# Patient Record
Sex: Female | Born: 1935 | Race: White | Hispanic: No | Smoking: Never smoker
Health system: Southern US, Community
[De-identification: ages and names within clinical notes are randomized; demographics above are authoritative.]

## PROBLEM LIST (undated history)

## (undated) DIAGNOSIS — K5792 Diverticulitis of intestine, part unspecified, without perforation or abscess without bleeding: Secondary | ICD-10-CM

## (undated) DIAGNOSIS — E079 Disorder of thyroid, unspecified: Secondary | ICD-10-CM

## (undated) DIAGNOSIS — M1990 Unspecified osteoarthritis, unspecified site: Secondary | ICD-10-CM

## (undated) DIAGNOSIS — I10 Essential (primary) hypertension: Secondary | ICD-10-CM

## (undated) DIAGNOSIS — K219 Gastro-esophageal reflux disease without esophagitis: Secondary | ICD-10-CM

## (undated) HISTORY — PX: JOINT REPLACEMENT: SHX530

## (undated) HISTORY — PX: CHOLECYSTECTOMY: SHX55

## (undated) HISTORY — DX: Gastro-esophageal reflux disease without esophagitis: K21.9

## (undated) HISTORY — PX: BREAST BIOPSY: SHX20

## (undated) HISTORY — PX: HERNIA REPAIR: SHX51

## (undated) HISTORY — DX: Disorder of thyroid, unspecified: E07.9

## (undated) HISTORY — PX: COLON SURGERY: SHX602

## (undated) HISTORY — PX: ROTATOR CUFF REPAIR: SHX139

## (undated) HISTORY — PX: EXCISION MORTON'S NEUROMA: SHX5013

## (undated) HISTORY — PX: CATARACT EXTRACTION: SUR2

---

## 2009-12-02 ENCOUNTER — Ambulatory Visit: Payer: Self-pay | Admitting: Family Medicine

## 2010-01-05 ENCOUNTER — Ambulatory Visit: Payer: Self-pay | Admitting: Family Medicine

## 2010-07-21 ENCOUNTER — Ambulatory Visit: Payer: Self-pay | Admitting: Family Medicine

## 2010-09-15 ENCOUNTER — Ambulatory Visit: Payer: Self-pay | Admitting: Gastroenterology

## 2010-10-06 ENCOUNTER — Ambulatory Visit: Payer: Self-pay | Admitting: Family Medicine

## 2011-01-07 ENCOUNTER — Ambulatory Visit: Payer: Self-pay | Admitting: Family Medicine

## 2011-01-26 ENCOUNTER — Ambulatory Visit: Payer: Self-pay | Admitting: Family Medicine

## 2012-01-26 ENCOUNTER — Ambulatory Visit: Payer: Self-pay | Admitting: Pediatrics

## 2013-01-26 ENCOUNTER — Ambulatory Visit: Payer: Self-pay | Admitting: Pediatrics

## 2014-03-05 ENCOUNTER — Ambulatory Visit: Payer: Self-pay | Admitting: Pediatrics

## 2016-11-24 ENCOUNTER — Ambulatory Visit
Admission: EM | Admit: 2016-11-24 | Discharge: 2016-11-24 | Disposition: A | Discharge status: Home / Self Care | Payer: Medicare Other | Attending: Family Medicine | Admitting: Family Medicine

## 2016-11-24 DIAGNOSIS — Z881 Allergy status to other antibiotic agents status: Secondary | ICD-10-CM | POA: Diagnosis not present

## 2016-11-24 DIAGNOSIS — Z9660 Presence of unspecified orthopedic joint implant: Secondary | ICD-10-CM | POA: Insufficient documentation

## 2016-11-24 DIAGNOSIS — Z9889 Other specified postprocedural states: Secondary | ICD-10-CM | POA: Diagnosis not present

## 2016-11-24 DIAGNOSIS — Z880 Allergy status to penicillin: Secondary | ICD-10-CM | POA: Diagnosis not present

## 2016-11-24 DIAGNOSIS — Z882 Allergy status to sulfonamides status: Secondary | ICD-10-CM | POA: Insufficient documentation

## 2016-11-24 DIAGNOSIS — I10 Essential (primary) hypertension: Secondary | ICD-10-CM | POA: Diagnosis not present

## 2016-11-24 DIAGNOSIS — M1990 Unspecified osteoarthritis, unspecified site: Secondary | ICD-10-CM | POA: Insufficient documentation

## 2016-11-24 DIAGNOSIS — E876 Hypokalemia: Secondary | ICD-10-CM | POA: Insufficient documentation

## 2016-11-24 DIAGNOSIS — R42 Dizziness and giddiness: Secondary | ICD-10-CM | POA: Insufficient documentation

## 2016-11-24 HISTORY — DX: Unspecified osteoarthritis, unspecified site: M19.90

## 2016-11-24 HISTORY — DX: Essential (primary) hypertension: I10

## 2016-11-24 HISTORY — DX: Diverticulitis of intestine, part unspecified, without perforation or abscess without bleeding: K57.92

## 2016-11-24 LAB — CBC WITH DIFFERENTIAL/PLATELET
BASOS ABS: 0 10*3/uL (ref 0–0.1)
Basophils Relative: 1 %
EOS PCT: 3 %
Eosinophils Absolute: 0.2 10*3/uL (ref 0–0.7)
HCT: 36 % (ref 35.0–47.0)
HEMOGLOBIN: 11.9 g/dL — AB (ref 12.0–16.0)
LYMPHS ABS: 1.3 10*3/uL (ref 1.0–3.6)
LYMPHS PCT: 22 %
MCH: 30.2 pg (ref 26.0–34.0)
MCHC: 33.1 g/dL (ref 32.0–36.0)
MCV: 91.5 fL (ref 80.0–100.0)
Monocytes Absolute: 0.4 10*3/uL (ref 0.2–0.9)
Monocytes Relative: 7 %
NEUTROS ABS: 3.9 10*3/uL (ref 1.4–6.5)
NEUTROS PCT: 67 %
PLATELETS: 211 10*3/uL (ref 150–440)
RBC: 3.94 MIL/uL (ref 3.80–5.20)
RDW: 14 % (ref 11.5–14.5)
WBC: 5.8 10*3/uL (ref 3.6–11.0)

## 2016-11-24 LAB — BASIC METABOLIC PANEL
ANION GAP: 7 (ref 5–15)
BUN: 29 mg/dL — ABNORMAL HIGH (ref 6–20)
CHLORIDE: 104 mmol/L (ref 101–111)
CO2: 28 mmol/L (ref 22–32)
Calcium: 8.7 mg/dL — ABNORMAL LOW (ref 8.9–10.3)
Creatinine, Ser: 0.97 mg/dL (ref 0.44–1.00)
GFR calc Af Amer: >60 mL/min (ref >60)
GFR, EST NON AFRICAN AMERICAN: 53 mL/min — AB (ref >60)
GLUCOSE: 110 mg/dL — AB (ref 65–99)
POTASSIUM: 3.2 mmol/L — AB (ref 3.5–5.1)
Sodium: 139 mmol/L (ref 135–145)

## 2016-11-24 MED ORDER — POTASSIUM CHLORIDE CRYS ER 20 MEQ PO TBCR
40.0000 meq | EXTENDED_RELEASE_TABLET | Freq: Once | ORAL | Status: AC
Start: 2016-11-24 — End: 2016-11-24
  Administered 2016-11-24: 40 meq via ORAL

## 2016-11-24 NOTE — Discharge Instructions (Signed)
Increase fluids and potassium rich foods Follow up with primary provider as scheduled

## 2016-11-24 NOTE — ED Triage Notes (Signed)
Pt reports dizziness x 1 month. Pt went to PT for back this morning and informed her she has been feeling dizzy. PCP called her today and stated the office would be closed tomorrow so her appointment was canceled. PT suggested she be seen today. Pt reports the dizziness is worse when turning over in bed. Endorses nausea at times. Denies confusion, slurred speech, unilateral weakness. A&O x 4. Gait steady

## 2016-11-24 NOTE — ED Provider Notes (Signed)
[facility]    CSN: [id number] Arrival date & time: 11/24/16  1244     History   Chief Complaint Chief Complaint  Patient presents with  . Dizziness    HPI Andrea Blake is a 83 y.o. female.   The history is provided by the patient.  Dizziness  Quality:  Lightheadedness Severity:  Moderate Onset quality:  Sudden Duration:  1 month Timing:  Intermittent Progression:  Waxing and waning Chronicity:  New Context: physical activity   Relieved by:  None tried Worsened by:  Movement and standing up Ineffective treatments:  None tried Associated symptoms: no blood in stool, no chest pain, no diarrhea, no headaches, no hearing loss, no nausea, no palpitations, no shortness of breath, no syncope, no tinnitus, no vision changes, no vomiting and no weakness   Risk factors: no anemia, no heart disease, no hx of stroke, no multiple medications and no new medications     Past Medical History:  Diagnosis Date  . Arthritis   . Diverticulitis   . Hypertension     There are no active problems to display for this patient.   Past Surgical History:  Procedure Laterality Date  . COLON SURGERY    . JOINT REPLACEMENT      OB History    No data available       Home Medications    Prior to Admission medications   Not on File    Family History No family history on file.  Social History Social History  Substance Use Topics  . Smoking status: Never Smoker  . Smokeless tobacco: Never Used  . Alcohol use No     Allergies   Flagyl [metronidazole]; Penicillins; and Sulfa antibiotics   Review of Systems Review of Systems  HENT: Negative for hearing loss and tinnitus.   Respiratory: Negative for shortness of breath.   Cardiovascular: Negative for chest pain, palpitations and syncope.  Gastrointestinal: Negative for blood in stool, diarrhea, nausea and vomiting.  Neurological: Positive for dizziness. Negative for weakness and headaches.      Physical Exam Triage Vital Signs ED Triage Vitals  Enc Vitals Group     BP 11/24/16 1257 (!) 134/47     Pulse Rate 11/24/16 1257 (!) 57     Resp 11/24/16 1257 16     Temp 11/24/16 1257 (!) 97.5 F (36.4 C)     Temp Source 11/24/16 1257 Oral     SpO2 11/24/16 1257 100 %     Weight 11/24/16 1258 180 lb (81.6 kg)     Height 11/24/16 1258 5\' 1"  (1.549 m)     Head Circumference --      Peak Flow --      Pain Score 11/24/16 1306 3     Pain Loc --      Pain Edu? --      Excl. in GC? --    No data found.   Updated Vital Signs BP (!) 134/47 (BP Location: Left Arm)   Pulse (!) 57   Temp (!) 97.5 F (36.4 C) (Oral)   Resp 16   Ht 5\' 1"  (1.549 m)   Wt 180 lb (81.6 kg)   SpO2 100%   BMI 34.01 kg/m   Visual Acuity Right Eye Distance:   Left Eye Distance:   Bilateral Distance:    Right Eye Near:   Left Eye Near:    Bilateral Near:     Physical Exam  Constitutional: She is oriented to person,  place, and time. She appears well-developed and well-nourished. No distress.  HENT:  Head: Normocephalic.  Right Ear: Tympanic membrane, external ear and ear canal normal.  Left Ear: Tympanic membrane, external ear and ear canal normal.  Nose: Nose normal.  Mouth/Throat: Oropharynx is clear and moist and mucous membranes are normal.  Eyes: Pupils are equal, round, and reactive to light. Conjunctivae and EOM are normal. Right eye exhibits no discharge. Left eye exhibits no discharge. No scleral icterus.  Neck: Normal range of motion. Neck supple. No JVD present. No tracheal deviation present. No thyromegaly present.  Cardiovascular: Normal rate, regular rhythm, normal heart sounds and intact distal pulses.   No murmur heard. Pulmonary/Chest: Effort normal and breath sounds normal. No stridor. No respiratory distress. She has no wheezes. She has no rales. She exhibits no tenderness.  Abdominal: Soft. Bowel sounds are normal. She exhibits no distension and no mass. There is no  tenderness. There is no rebound and no guarding.  Musculoskeletal: She exhibits no edema or tenderness.  Lymphadenopathy:    She has no cervical adenopathy.  Neurological: She is alert and oriented to person, place, and time. She has normal reflexes. She displays normal reflexes. No cranial nerve deficit or sensory deficit. She exhibits normal muscle tone. Coordination normal.  Skin: Skin is warm and dry. No rash noted. She is not diaphoretic. No erythema. No pallor.  Psychiatric: She has a normal mood and affect. Her behavior is normal. Judgment and thought content normal.  Vitals reviewed.    UC Treatments / Results  Labs (all labs ordered are listed, but only abnormal results are displayed) Labs Reviewed  CBC WITH DIFFERENTIAL/PLATELET - Abnormal; Notable for the following:       Result Value   Hemoglobin 11.9 (*)    All other components within normal limits  BASIC METABOLIC PANEL - Abnormal; Notable for the following:    Potassium 3.2 (*)    Glucose, Bld 110 (*)    BUN 29 (*)    Calcium 8.7 (*)    GFR calc non Af Amer 53 (*)    All other components within normal limits    EKG  EKG Interpretation None       Radiology No results found.  Procedures Procedures (including critical care time)  Medications Ordered in UC Medications  potassium chloride SA (K-DUR,KLOR-CON) CR tablet 40 mEq (40 mEq Oral Given 11/24/16 1450)     Initial Impression / Assessment and Plan / UC Course  I have reviewed the triage vital signs and the nursing notes.  Pertinent labs & imaging results that were available during my care of the patient were reviewed by me and considered in my medical decision making (see chart for details).       Final Clinical Impressions(s) / UC Diagnoses   Final diagnoses:  Lightheadedness  Hypokalemia    New Prescriptions There are no discharge medications for this patient. 1. Lab results and diagnosis reviewed with patient 2.patient given KCL 40  MEQ x 1  3. Recommend supportive treatment with increased fluids; f/u with PCP for recheck labs (potassium) 4. Follow-up prn if symptoms worsen or don't improve   Controlled Substance Prescriptions [place] Controlled Substance Registry consulted? Not Applicable   Payton Mccallum, MD 11/24/16 1949

## 2017-01-14 ENCOUNTER — Other Ambulatory Visit: Payer: Self-pay | Admitting: Otolaryngology

## 2017-01-14 DIAGNOSIS — R42 Dizziness and giddiness: Secondary | ICD-10-CM

## 2017-01-25 ENCOUNTER — Ambulatory Visit: Payer: Medicare Other

## 2017-04-14 ENCOUNTER — Other Ambulatory Visit: Payer: Self-pay | Admitting: Family Medicine

## 2017-04-14 DIAGNOSIS — Z1239 Encounter for other screening for malignant neoplasm of breast: Secondary | ICD-10-CM

## 2017-04-28 ENCOUNTER — Ambulatory Visit
Admission: RE | Admit: 2017-04-28 | Discharge: 2017-04-28 | Disposition: A | Discharge status: Home / Self Care | Payer: Medicare Other | Source: Ambulatory Visit | Attending: Family Medicine | Admitting: Family Medicine

## 2017-04-28 DIAGNOSIS — Z1231 Encounter for screening mammogram for malignant neoplasm of breast: Secondary | ICD-10-CM | POA: Diagnosis present

## 2017-04-28 DIAGNOSIS — Z1239 Encounter for other screening for malignant neoplasm of breast: Secondary | ICD-10-CM

## 2018-02-15 ENCOUNTER — Ambulatory Visit: Payer: Medicare Other | Attending: Geriatric Medicine | Admitting: Physical Therapy

## 2018-02-15 DIAGNOSIS — M79641 Pain in right hand: Secondary | ICD-10-CM | POA: Insufficient documentation

## 2018-02-15 DIAGNOSIS — M79642 Pain in left hand: Secondary | ICD-10-CM | POA: Insufficient documentation

## 2018-02-15 DIAGNOSIS — M6281 Muscle weakness (generalized): Secondary | ICD-10-CM | POA: Insufficient documentation

## 2018-02-15 DIAGNOSIS — R278 Other lack of coordination: Secondary | ICD-10-CM | POA: Insufficient documentation

## 2018-02-15 NOTE — Therapy (Signed)
Pt. Was scheduled on PT schedule and arrived prior to determining MD order specified OT evaluation and treatment.  Pt. Was rescheduled with OT [facility]

## 2018-02-16 ENCOUNTER — Encounter: Payer: Self-pay | Admitting: Occupational Therapy

## 2018-02-16 ENCOUNTER — Ambulatory Visit: Payer: Medicare Other | Admitting: Occupational Therapy

## 2018-02-16 ENCOUNTER — Other Ambulatory Visit: Payer: Self-pay

## 2018-02-16 DIAGNOSIS — M79641 Pain in right hand: Secondary | ICD-10-CM | POA: Diagnosis present

## 2018-02-16 DIAGNOSIS — R278 Other lack of coordination: Secondary | ICD-10-CM

## 2018-02-16 DIAGNOSIS — M6281 Muscle weakness (generalized): Secondary | ICD-10-CM

## 2018-02-16 DIAGNOSIS — M79642 Pain in left hand: Secondary | ICD-10-CM | POA: Diagnosis present

## 2018-02-16 NOTE — Addendum Note (Signed)
Addended by: Avon GullyJAGENTENFL, Kleo Dungee M on: 02/16/2018 04:34 PM   Modules accepted: Orders

## 2018-02-16 NOTE — Therapy (Addendum)
[facility] MAIN [facility] SERVICES [address] [place], [place], [address] Phone: [phone]   Fax:  [phone]  Occupational Therapy Evaluation  Patient Details  Name: Andrea Blake MRN: [id number] Date of Birth: December 24, 1931 Referring Provider (OT): Dr. Rosie Fate   Encounter Date: 02/16/2018  OT End of Session - 02/16/18 1130    Visit Number  1    Number of Visits  24    Date for OT Re-Evaluation  05/11/18    Authorization Type  Visit 1 of 10 for progress report period starting 02/16/2018    OT Start Time  0830    OT Stop Time  0930    OT Time Calculation (min)  60 min    Activity Tolerance  Patient tolerated treatment well    Behavior During Therapy  [facility] for tasks assessed/performed       Past Medical History:  Diagnosis Date  . Arthritis   . Diverticulitis   . Hypertension     Past Surgical History:  Procedure Laterality Date  . BREAST BIOPSY     fna, ?side-neg  . COLON SURGERY    . JOINT REPLACEMENT      There were no vitals filed for this visit.  Subjective Assessment - 02/16/18 1119    Subjective   Pt. reports she had a hard time finding an OT.    Pertinent History  Pt. is an 84 y.o. female who was referred for OT services by her physician secondary to Bilateral Hand Osteoarthritis, a history of back pain, and a history of right shoulder limitations secondary to Rotator Cuff Repair in 2011. Pt. resides at home alone in a 55 and older independent living community. Pt. has a supportive daughter who lives relatively close by who is able to assist if needed. Pt. has a housekeeper who assisted with home cleaning management. Pt.'s ahas a history of back pain which ranges  from 3-8/10 making it difficult to tolerate activity in standing. Pt. 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      Patient Stated Goals  To be able to use her hands without dropping items.    Currently in Pain?  No/denies         [facility] OT Assessment - 02/16/18 0835      Assessment   Medical Diagnosis  Bilateral Hand Osteoarthritis    Referring Provider (OT)  Dr. Susette Racer, Rosie Fate   Onset Date/Surgical Date  01/25/18    Hand Dominance  Right    Next MD Visit  03/01/2018      Precautions   Required Braces or Orthoses  --   Compression socks     Balance Screen   Has the patient fallen in the past 6 months  No    Has the patient had a decrease in activity level because of a fear of falling?   Yes    Is the patient reluctant to leave their home because of a fear of falling?   No      Home  Environment   Family/patient expects to be discharged to:  Private residence    Living Arrangements  Alone    Available Help at Discharge  Family    Type of Home  House   55 and older    Home Access  Level entry    Home Layout  One level    Bathroom Shower/Tub  Company secretary  Standard    Home Equipment  Bedside commode;Hand held shower head   Rollator   Lives With  Alone      Prior Function   Level of Independence  Independent    Vocation  Retired   Engineer, civil (consulting)   Leisure  --   Quilting     ADL   Eating/Feeding  --   Difficulty stabilizing fork whils cutting. Opening packets.   Grooming  --   Difficulty reaching up for haircare.   Upper Body Bathing  Independent    Lower Body Bathing  Independent    Upper Body Dressing  Independent   Difficulty hooking bra in the back. Now wears sports bras   Lower Body Dressing  Moderate assistance    Toilet Transfer  Independent    Toileting -  Hygiene  Independent    Tub/Shower Transfer  Modified independent   with grab bar     IADL   Shopping  --   Back pain with prolonged standing in line in the store.   Light Housekeeping  --   Has assist with cleaning, mopping. Has a housekeeper.   Meal Prep  --   Difficulty standing secondary to pain, DOes simple meals.   Prior Level of Function Medication Managment  --   Independent    Medication Management  Is responsible for taking medication in correct dosages at correct time   Independent   Financial Management  Manages financial matters independently (budgets, writes checks, pays rent, bills goes to bank), collects and keeps track of income      Written Expression   Dominant Hand  Right    Handwriting  90% legible      Vision - History   Baseline Vision  Bifocals   Prism classes     Activity Tolerance   Activity Tolerance  Tolerates 10-20 min activity with multiple rests      Cognition   Overall Cognitive Status  Within Functional Limits for tasks assessed      Sensation   Light Touch  Appears Intact    Stereognosis  Appears Intact      Coordination   Gross Motor Movements are Fluid and Coordinated  Yes    Fine Motor Movements are Fluid and Coordinated  No    Right 9 Hole Peg Test  32    Left 9 Hole Peg Test  30      AROM   Overall AROM Comments  Right shoulder flexion 85, abduction 83. Limited fleft hand fisting: Digiit flexion to [facility]: 2nd: 4cm, 3rd: 5cm, 4th: 4cm, 5th: 4 cm. Left hand: 3rd digit: 3cm , 2nd, 4th, and 5th WNL   History of right shoulder limitiations due to RTC Repair      Strength   Overall Strength Comments  Right shoulder flexion, abduction 3-/5, elbow flexion, extension, and wrist extension 4+/5, LUE: 4/5 overall.      Hand Function   Right Hand Grip (lbs)  22    Right Hand Lateral Pinch  7 lbs    Right Hand 3 Point Pinch  5 lbs    Left Hand Grip (lbs)  3    Left Hand Lateral Pinch  4 lbs    Left 3 point pinch  5 lbs                           OT Long Term Goals - 02/16/18 1224      OT LONG TERM GOAL #  1   Title  Pt. will increase BUE strength by 2 mm grades to assist with ADLs, and IADLs    Baseline  Eval: BUE Weakness    Time  12    Period  Weeks    Status  New    Target Date  05/11/18      OT LONG TERM GOAL #2   Title  Pt. will perform LE dressing with Modified independence.    Baseline  Eval:  Pt. requires ModA    Time  12    Period  Weeks    Status  New    Target Date  05/11/18      OT LONG TERM GOAL #3   Title  Pt. will improve grip strength to be able to independently hold and open jars, and bottles    Baseline  Eval: Pt. has difficulty holding, and opening jars and bottles.     Time  12    Period  Weeks    Status  New    Target Date  05/11/18      OT LONG TERM GOAL #4   Title  Pt. will improve lateral pinch to be able to independently stabilize a fork while cutting food.     Baseline  EVal: Pt. is unable to stabilize a fork while cutting food.    Time  12    Period  Weeks    Status  New    Target Date  05/11/18      OT LONG TERM GOAL #5   Title  Pt. will independently demonstrate work simplification techniques, energy conservation, and joint protection principals during ADL, and IADLs.    Baseline  Eval: Education to be provided.    Time  12    Period  Weeks    Status  New    Target Date  05/11/18      Long Term Additional Goals   Additional Long Term Goals  Yes      OT LONG TERM GOAL #6   Title  Pt. will improve [facility] skills to be able to handle and count money/change.    Baseline  Eval: pt. is unable    Time  12    Period  Weeks    Status  New    Target Date  05/11/18      OT LONG TERM GOAL #7   Title  Pt. will increase left hand digit flexion ROM to be able to independently hold a small object in the palm of her hand without dropping it.      Baseline  Eval: Limited ability to formulate a composite fist.    Time  12    Period  Weeks    Status  New    Target Date  05/11/18      OT LONG TERM GOAL #8   Title  Pt. will be independent wperforming haircare    Baseline  Eval: Pt. is unable to complete with her right UE.    Time  12    Period  Weeks    Status  New    Target Date  05/11/18            Plan - 02/16/18 1132    Clinical Impression Statement  Pt. is an 84 y.o. female who was referred for OT services secondary to Bilateral hand  Osteoarthritis. Pt. presents with right shoulder limitations from rotator cuff surgery in 2011, limiting her abiliaty to reach up with her RUE during self-grooming  tasks, and IADLs.. Pt. has a history of back pain limiting her ability to stand to complete meal preparation, wwaiting in line at the grocery store, and perform LE dressing. Pt. presents with limited bilateral hand ROM, is unable to make a fist with the left hand, making it difficult to hold utensils steady while cutting, open medication bottles, zipping, handling, and counting money, and writing. Pt. scored 64/80 Sum score/61.1 MAM Measure score on the MAM-20. Pt. will benefit from OT serivces to work on improving UE strength, and coordination skills in order to improves her ability to complete ADL, and IADL tasks,  and review compensatory strategies to increase independence with daily self-care.    Occupational Profile and client history currently impacting functional performance  Pt. is a retired Engineer, civil (consulting), and participates in quilting shows.    Occupational performance deficits (Please refer to evaluation for details):  ADL's;IADL's;Leisure;Social Participation    Rehab Potential  Good    OT Frequency  2x / week    OT Duration  12 weeks    OT Treatment/Interventions  Self-care/ADL training;DME and/or AE instruction;Patient/family education;Paraffin;Moist Heat;Energy conservation;Therapeutic activities;Passive range of motion    Clinical Decision Making  Several treatment options, min-mod task modification necessary    Consulted and Agree with Plan of Care  Patient       Patient will benefit from skilled therapeutic intervention in order to improve the following deficits and impairments:  Decreased balance, Impaired UE functional use, Decreased strength, Decreased range of motion, Decreased endurance, Decreased activity tolerance, Pain, Decreased knowledge of precautions, Decreased coordination  Visit Diagnosis: Muscle weakness  (generalized)  Other lack of coordination    Problem List There are no active problems to display for this patient.   Olegario Messier, MS, OTR/L 02/16/2018, 3:22 PM  [facility] MAIN [facility] SERVICES [address] [place], [place], [address] Phone: [phone]   Fax:  [phone]  Name: Andrea Blake MRN: [id number] Date of Birth: 07/27/1931

## 2018-02-20 ENCOUNTER — Ambulatory Visit: Payer: Medicare Other | Admitting: Occupational Therapy

## 2018-02-20 ENCOUNTER — Encounter: Payer: Self-pay | Admitting: Occupational Therapy

## 2018-02-20 DIAGNOSIS — R278 Other lack of coordination: Secondary | ICD-10-CM

## 2018-02-20 DIAGNOSIS — M6281 Muscle weakness (generalized): Secondary | ICD-10-CM

## 2018-02-20 DIAGNOSIS — M79641 Pain in right hand: Secondary | ICD-10-CM | POA: Diagnosis not present

## 2018-02-20 NOTE — Therapy (Signed)
[facility] MAIN [facility] [address] [place], [place], [address] Phone: [phone]   Fax:  [phone]  Occupational Therapy Treatment  Patient Details  Name: Andrea AntuBarbara Shook Blake MRN: [id number] Date of Birth: [birth year] Referring Provider (OT): Dr. Claris CheMargaret Blake,Dr. Rosie FateLauren Blake   Encounter Date: 02/20/2018  OT End of Session - 02/20/18 0835    Visit Number  2    Number of Visits  24    Date for OT Re-Evaluation  05/11/18    Authorization Type  Visit 2 of 10 for progress report period starting 02/16/2018    OT Start Time  0833    OT Stop Time  0915    OT Time Calculation (min)  42 min    Activity Tolerance  Patient tolerated treatment well    Behavior During Therapy  [facility] for tasks assessed/performed       Past Medical History:  Diagnosis Date  . Arthritis   . Diverticulitis   . Hypertension     Past Surgical History:  Procedure Laterality Date  . BREAST BIOPSY     fna, ?side-neg  . COLON SURGERY    . JOINT REPLACEMENT      There were no vitals filed for this visit.  Subjective Assessment - 02/20/18 0835    Subjective   Pt. reports no new changes    Pertinent History  Pt. is an 84 y.o. female who was referred for OT services by her physician secondary to Bilateral Hand Osteoarthritis, a history of back pain, and a history of right shoulder limitations secondary to Rotator Cuff Repair in 2011. Pt. resides at home alone in a 55 and older independent living community. Pt. has a supportive daughter who lives relatively close by who is able to assist if needed. Pt. has a housekeeper who assisted with home cleaning management. Pt.'s ahas a history of back pain which ranges  from 3-8/10 making it difficult to tolerate activity in standing. Pt. is a retired Engineer, civil (consulting)nurse, and an avid Therapist, occupationalquilter which she has won Radiographer, therapeuticawards for.      Patient Stated Goals  To be able to use her hands without dropping items.    Currently in Pain?   No/denies      OT TREATMENT    Neuro muscular re-education:  Pt. worked on grasping coins with her right hand, and moved the objects through her hand from her palm to the tip of her 2nd digit, and thumb in preparation for stacking the coins. Pt. had difficulty with translatory movements of the hand, however did not drop any.  Therapeutic Exercise:  Pt. performed hand strengthening with tan theraputty. Pt. required visual, and verbal cues for proper technique. Pt. worked on gross grip loop, lateral pinch, 3pt. pinch, gross digit extension, digit extension table spread, thumb opposition, and lumbical ex,  Pt. required verbal and tactile cues for proper technique. Pt. was provided with a [employer] HEP for Theraputty Ex                               OT Long Term Goals - 02/16/18 1224      OT LONG TERM GOAL #1   Title  Pt. will increase BUE strength by 2 mm grades to assist with ADLs, and IADLs    Baseline  Eval: BUE Weakness    Time  12    Period  Weeks    Status  New  Target Date  05/11/18      OT LONG TERM GOAL #2   Title  Pt. will perform LE dressing with Modified independence.    Baseline  Eval: Pt. requires ModA    Time  12    Period  Weeks    Status  New    Target Date  05/11/18      OT LONG TERM GOAL #3   Title  Pt. will improve grip strength to be able to independently hold and open jars, and bottles    Baseline  Eval: Pt. has difficulty holding, and opening jars and bottles.     Time  12    Period  Weeks    Status  New    Target Date  05/11/18      OT LONG TERM GOAL #4   Title  Pt. will improve lateral pinch to be able to independently stabilize a fork while cutting food.     Baseline  Eval: Pt. is unable to stabilize a fork while cutting food.    Time  12    Period  Weeks    Status  New    Target Date  05/11/18      OT LONG TERM GOAL #5   Title  Pt. will independently demonstrate work simplification techniques, energy conservation,  and joint protection principals during ADL, and IADLs.    Baseline  Eval: Education to be provided.    Time  12    Period  Weeks    Status  New    Target Date  05/11/18      Long Term Additional Goals   Additional Long Term Goals  Yes      OT LONG TERM GOAL #6   Title  Pt. will improve [facility] skills to be able to handle and count money/change.    Baseline  Eval: pt. is unable    Time  12    Period  Weeks    Status  New    Target Date  05/11/18      OT LONG TERM GOAL #7   Title  Pt. will increase left hand digit flexion ROM to be able to independently hold a small object in the palm of her hand without dropping it.      Baseline  Eval: Limited ability to formulate a composite fist.    Time  12    Period  Weeks    Status  New    Target Date  05/11/18      OT LONG TERM GOAL #8   Title  Pt. will be independent wperforming haircare    Baseline  Eval: Pt. is unable to complete with her right UE.    Time  12    Period  Weeks    Status  New    Target Date  05/11/18            Plan - 02/20/18 0847    Clinical Impression Statemen Pt. [place] for a [employer], and a Musician. Pt. was provided with red adaptive foam to build up utensils, and items for ADLs. Pt. reported the she preferred not to use the adaptive foam on the fork when stabilizing the fork when cutting.  Pt. education about work simplification techniques for ADLs. Pt. tolerated tan theraputty exercises, and was provided with a HEP. Pt. continues to work on improving UE strength, and [facility] skills.    Occupational Profile  and client history currently impacting functional performance  Pt. is a retired Engineer, civil (consulting), and participates in quilting shows.    Occupational performance deficits (Please refer to evaluation for details):  ADL's;IADL's;Leisure;Social Participation    Rehab Potential  Good    OT Frequency  2x / week    OT Duration  12 weeks    OT Treatment/Interventions   Self-care/ADL training;DME and/or AE instruction;Patient/family education;Paraffin;Moist Heat;Energy conservation;Therapeutic activities;Passive range of motion    Clinical Decision Making  Several treatment options, min-mod task modification necessary    Consulted and Agree with Plan of Care  Patient       Patient will benefit from skilled therapeutic intervention in order to improve the following deficits and impairments:  Decreased balance, Impaired UE functional use, Decreased strength, Decreased range of motion, Decreased endurance, Decreased activity tolerance, Pain, Decreased knowledge of precautions, Decreased coordination  Visit Diagnosis: Muscle weakness (generalized)  Other lack of coordination    Problem List There are no active problems to display for this patient.   Olegario Messier, MS, OTR/L 02/20/2018, 10:36 AM  [facility] MAIN [facility] SERVICES [address] [place], [place], [address] Phone: [phone]   Fax:  [phone]  Name: Andrea Blake MRN: [id number] Date of Birth: May 28, 1931

## 2018-02-22 ENCOUNTER — Ambulatory Visit: Payer: Medicare Other | Admitting: Occupational Therapy

## 2018-02-28 ENCOUNTER — Encounter: Payer: Medicare Other | Admitting: Occupational Therapy

## 2018-03-02 ENCOUNTER — Encounter: Payer: Medicare Other | Admitting: Occupational Therapy

## 2018-03-03 ENCOUNTER — Ambulatory Visit: Payer: Medicare Other | Admitting: Occupational Therapy

## 2018-03-10 ENCOUNTER — Ambulatory Visit: Payer: Medicare Other | Admitting: Occupational Therapy

## 2018-03-13 ENCOUNTER — Ambulatory Visit: Payer: Medicare Other | Admitting: Occupational Therapy

## 2018-03-17 ENCOUNTER — Ambulatory Visit: Payer: Medicare Other | Attending: Geriatric Medicine | Admitting: Occupational Therapy

## 2018-03-17 ENCOUNTER — Encounter: Payer: Self-pay | Admitting: Occupational Therapy

## 2018-03-17 DIAGNOSIS — M25642 Stiffness of left hand, not elsewhere classified: Secondary | ICD-10-CM | POA: Insufficient documentation

## 2018-03-17 DIAGNOSIS — M6281 Muscle weakness (generalized): Secondary | ICD-10-CM | POA: Diagnosis present

## 2018-03-17 DIAGNOSIS — M79641 Pain in right hand: Secondary | ICD-10-CM | POA: Diagnosis present

## 2018-03-17 DIAGNOSIS — M79642 Pain in left hand: Secondary | ICD-10-CM | POA: Diagnosis present

## 2018-03-17 DIAGNOSIS — R278 Other lack of coordination: Secondary | ICD-10-CM | POA: Diagnosis not present

## 2018-03-18 NOTE — Therapy (Signed)
[facility] MAIN [facility] SERVICES [address] [place], [place], [address] Phone: [phone]   Fax:  [phone]  Occupational Therapy Treatment  Patient Details  Name: Andrea Blake MRN: [id number] Date of Birth: 04-13-1931 Referring Provider (OT): Dr. Claris Che Drickamer,Dr. Rosie Fate   Encounter Date: 03/17/2018  OT End of Session - 03/18/18 1633    Visit Number  3    Number of Visits  24    Date for OT Re-Evaluation  05/11/18    Authorization Type  Visit 3 of 10 for progress report period starting 02/16/2018    OT Start Time  0930    OT Stop Time  1015    OT Time Calculation (min)  45 min    Activity Tolerance  Patient tolerated treatment well    Behavior During Therapy  [facility] for tasks assessed/performed       Past Medical History:  Diagnosis Date  . Arthritis   . Diverticulitis   . Hypertension     Past Surgical History:  Procedure Laterality Date  . BREAST BIOPSY     fna, ?side-neg  . COLON SURGERY    . JOINT REPLACEMENT      There were no vitals filed for this visit.  Subjective Assessment - 03/17/18 0939    Subjective   Patient reports she had bronchitis and then had issues with her plumbing at home and had to cancel appts.     Pertinent History  Pt. is an 85 y.o. female who was referred for OT services by her physician secondary to Bilateral Hand Osteoarthritis, a history of back pain, and a history of right shoulder limitations secondary to Rotator Cuff Repair in 2011. Pt. resides at home alone in a 55 and older independent living community. Pt. has a supportive daughter who lives relatively close by who is able to assist if needed. Pt. has a housekeeper who assisted with home cleaning management. Pt.'s ahas a history of back pain which ranges  from 3-8/10 making it difficult to tolerate activity in standing. Pt. 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      Patient Stated Goals  To  be able to use her hands without dropping items.    Currently in Pain?  No/denies    Multiple Pain Sites  No          Moist Heat to bilateral hands prior to exercises.  Gentle ROM and tendon gliding exercises with cues.  Patient indicating issues with sorting medication and difficulty with fine motor skills to complete.  Activity analysis performed and recommend patient pour small amount of pills into left hand and then pick up one by one with right hand to place into pill organizer, patient was performing prior with right hand only and attempting to use hand for storage and translatory skills of the hand which is a more advanced fine motor coordination task.   Difficulty with cutting meat at home, analyzed task and instruction on alternative gripping on utensil and use of built up handles, patient has a piece of red foam at home to try.  Instructed on use of rocker knife to assist with cutting as well.   Patient has paraffin bath at home but needs to replace wax. Patient instructed on joint protection principles with use of built up handles, modified gripping patterns and modification to activities.  OT Education - 03/18/18 1633    Education Details  joint protection principles, exercises    Person(s) Educated  Patient    Methods  Explanation;Demonstration    Comprehension  Verbalized understanding;Returned demonstration          OT Long Term Goals - 02/16/18 1224      OT LONG TERM GOAL #1   Title  Pt. will increase BUE strength by 2 mm grades to assist with ADLs, and IADLs    Baseline  Eval: BUE Weakness    Time  12    Period  Weeks    Status  New    Target Date  05/11/18      OT LONG TERM GOAL #2   Title  Pt. will perform LE dressing with Modified independence.    Baseline  Eval: Pt. requires ModA    Time  12    Period  Weeks    Status  New    Target Date  05/11/18      OT LONG TERM GOAL #3   Title  Pt. will improve grip strength to be  able to independently hold and open jars, and bottles    Baseline  Eval: Pt. has difficulty holding, and opening jars and bottles.     Time  12    Period  Weeks    Status  New    Target Date  05/11/18      OT LONG TERM GOAL #4   Title  Pt. will improve lateral pinch to be able to independently stabilize a fork while cutting food.     Baseline  Eval: Pt. is unable to stabilize a fork while cutting food.    Time  12    Period  Weeks    Status  New    Target Date  05/11/18      OT LONG TERM GOAL #5   Title  Pt. will independently demonstrate work simplification techniques, energy conservation, and joint protection principals during ADL, and IADLs.    Baseline  Eval: Education to be provided.    Time  12    Period  Weeks    Status  New    Target Date  05/11/18      Long Term Additional Goals   Additional Long Term Goals  Yes      OT LONG TERM GOAL #6   Title  Pt. will improve [facility] skills to be able to handle and count money/change.    Baseline  Eval: pt. is unable    Time  12    Period  Weeks    Status  New    Target Date  05/11/18      OT LONG TERM GOAL #7   Title  Pt. will increase left hand digit flexion ROM to be able to independently hold a small object in the palm of her hand without dropping it.      Baseline  Eval: Limited ability to formulate a composite fist.    Time  12    Period  Weeks    Status  New    Target Date  05/11/18      OT LONG TERM GOAL #8   Title  Pt. will be independent wperforming haircare    Baseline  Eval: Pt. is unable to complete with her right UE.    Time  12    Period  Weeks    Status  New    Target Date  05/11/18  Plan - 03/18/18 1634    Clinical Impression Statement  Patient missed several appointments due to being sick with bronchitis and also having some plumbing issues at home.  Patient continues to have difficulty with tasks at home specifically cutting meat and managing medications, focused on these areas today and  patient to modify activities at home and report back.  Will plan to transition patient next week to hand clinic for continued therapy.     Occupational Profile and client history currently impacting functional performance  Pt. is a retired Engineer, civil (consulting)nurse, and participates in quilting shows.    Occupational performance deficits (Please refer to evaluation for details):  ADL's;IADL's;Leisure;Social Participation    Rehab Potential  Good    OT Frequency  2x / week    OT Duration  12 weeks    OT Treatment/Interventions  Self-care/ADL training;DME and/or AE instruction;Patient/family education;Paraffin;Moist Heat;Energy conservation;Therapeutic activities;Passive range of motion    Consulted and Agree with Plan of Care  Patient       Patient will benefit from skilled therapeutic intervention in order to improve the following deficits and impairments:  Decreased balance, Impaired UE functional use, Decreased strength, Decreased range of motion, Decreased endurance, Decreased activity tolerance, Pain, Decreased knowledge of precautions, Decreased coordination  Visit Diagnosis: Other lack of coordination  Muscle weakness (generalized)  Bilateral hand pain    Problem List There are no active problems to display for this patient.  Kerrie Buffalomy T Patrcia Schnepp, OTR/L, CLT  Abri Vacca 03/18/2018, 4:40 PM  [facility] [address] [place], [place], [address] Phone: [phone]   Fax:  [phone]  Name: Marta AntuBarbara Shook Easton MRN: [id number] Date of Birth: 15-Sep-1931

## 2018-03-21 ENCOUNTER — Encounter: Payer: Medicare Other | Admitting: Occupational Therapy

## 2018-03-23 ENCOUNTER — Ambulatory Visit: Payer: Medicare Other | Admitting: Occupational Therapy

## 2018-03-23 ENCOUNTER — Encounter: Payer: Medicare Other | Admitting: Occupational Therapy

## 2018-03-23 DIAGNOSIS — M79641 Pain in right hand: Secondary | ICD-10-CM

## 2018-03-23 DIAGNOSIS — R278 Other lack of coordination: Secondary | ICD-10-CM | POA: Diagnosis not present

## 2018-03-23 DIAGNOSIS — M6281 Muscle weakness (generalized): Secondary | ICD-10-CM

## 2018-03-23 DIAGNOSIS — M79642 Pain in left hand: Secondary | ICD-10-CM

## 2018-03-23 DIAGNOSIS — M25642 Stiffness of left hand, not elsewhere classified: Secondary | ICD-10-CM

## 2018-03-23 NOTE — Therapy (Signed)
[place] [facility] PHYSICAL AND SPORTS MEDICINE [address] [place], [place], [address] Phone: [phone]   Fax:  [phone]  Occupational Therapy Treatment  Patient Details  Name: Andrea AntuBarbara Shook Blake MRN: [id number] Date of Birth: 27-Sep-1931 Referring Provider (OT): Dr. Claris CheMargaret Drickamer,Dr. Rosie FateLauren Hartman   Encounter Date: 03/23/2018  OT End of Session - 03/23/18 1108    Visit Number  4    Number of Visits  24    Date for OT Re-Evaluation  05/11/18    Authorization Type  Visit 4 of 10 for progress report period starting 02/16/2018    OT Start Time  0925    OT Stop Time  1040    OT Time Calculation (min)  75 min    Activity Tolerance  Patient tolerated treatment well    Behavior During Therapy  [facility] for tasks assessed/performed       Past Medical History:  Diagnosis Date  . Arthritis   . Diverticulitis   . Hypertension     Past Surgical History:  Procedure Laterality Date  . BREAST BIOPSY     fna, ?side-neg  . COLON SURGERY    . JOINT REPLACEMENT      There were no vitals filed for this visit.  Subjective Assessment - 03/23/18 1051    Subjective   My hands got weaker , and stiff- some pain - did had bilateral thumb surgeries in the past - L wors than the R - dropping things and dexterity not as good -     Patient Stated Goals  To be able to use her hands without dropping items.    Currently in Pain?  No/denies         [facility] Assessment - 03/23/18 0001      Strength   Right Hand Grip (lbs)  25    Right Hand Lateral Pinch  9 lbs    Right Hand 3 Point Pinch  7 lbs    Left Hand Grip (lbs)  18    Left Hand Lateral Pinch  4 lbs    Left Hand 3 Point Pinch  4 lbs      Right Hand AROM   R Index  MCP 0-90  85 Degrees    R Index PIP 0-100  95 Degrees    R Long  MCP 0-90  85 Degrees    R Long PIP 0-100  55 Degrees    R Ring  MCP 0-90  90 Degrees    R Ring PIP 0-100  100 Degrees    R Little  MCP 0-90  90 Degrees    R Little  PIP 0-100  100 Degrees      Left Hand AROM   L Index  MCP 0-90  90 Degrees    L Index PIP 0-100  80 Degrees    L Long  MCP 0-90  80 Degrees    L Long PIP 0-100  75 Degrees    L Ring  MCP 0-90  90 Degrees    L Ring PIP 0-100  70 Degrees    L Little  MCP 0-90  90 Degrees    L Little PIP 0-100  60 Degrees      Pt was seen by another OT for 3 sessions- and refer to me for OT at hand clinic -  Measurement taken and compare to month ago - pt was sick with Bronchitis and did miss appt with OT  Pt show increase grip strength  bilateral R more than L -and prehension increase in R hand but not L hand   some pain reported with putty in L hand   pt show decrease AROM in L hand - R hand WFL except 3rd digit  Pt has paraffin bath at home but do not have paraffin and did not use it for about 2 yrs  pt ed on using paraffin at home prior to ROM - to increase AROM in L hand - and to maintain and decrease stiffness in R hand  And decrease pain  Pt can cont with putty in the R hand and 3 point - but not L hand - hold off and work on AROM   Pt ed on HEP :  Paraffin bath Tendon glides - blocked Opposition to all digits  10 reps  pain free Am and PM   Review and demo Joint protection - hand out - use larger joints and avoid tight and sustained grip - went over her activities at home - and how to modify  AE education done - built up handles, spring loaded scissors, Penagain, [employer] , insulation tubing , ect  Hand out on modifications for cooking and kitchen activities  Provided too   Pt going to have surgery and will contact me when ready to return             OT Treatments/Exercises (OP) - 03/23/18 0001      RUE Paraffin   Number Minutes Paraffin  10 Minutes    RUE Paraffin Location  Hand    Comments  prior ROM       LUE Paraffin   Number Minutes Paraffin  10 Minutes    LUE Paraffin Location  Hand    Comments  prior to ROM              OT Education - 03/23/18 1108     Education Details  HEP , joint protection and AE     Person(s) Educated  Patient    Methods  Explanation;Demonstration;Handout    Comprehension  Verbalized understanding;Returned demonstration          OT Long Term Goals - 02/16/18 1224      OT LONG TERM GOAL #1   Title  Pt. will increase BUE strength by 2 mm grades to assist with ADLs, and IADLs    Baseline  Eval: BUE Weakness    Time  12    Period  Weeks    Status  New    Target Date  05/11/18      OT LONG TERM GOAL #2   Title  Pt. will perform LE dressing with Modified independence.    Baseline  Eval: Pt. requires ModA    Time  12    Period  Weeks    Status  New    Target Date  05/11/18      OT LONG TERM GOAL #3   Title  Pt. will improve grip strength to be able to independently hold and open jars, and bottles    Baseline  Eval: Pt. has difficulty holding, and opening jars and bottles.     Time  12    Period  Weeks    Status  New    Target Date  05/11/18      OT LONG TERM GOAL #4   Title  Pt. will improve lateral pinch to be able to independently stabilize a fork while cutting food.     Baseline  Eval:  Pt. is unable to stabilize a fork while cutting food.    Time  12    Period  Weeks    Status  New    Target Date  05/11/18      OT LONG TERM GOAL #5   Title  Pt. will independently demonstrate work simplification techniques, energy conservation, and joint protection principals during ADL, and IADLs.    Baseline  Eval: Education to be provided.    Time  12    Period  Weeks    Status  New    Target Date  05/11/18      Long Term Additional Goals   Additional Long Term Goals  Yes      OT LONG TERM GOAL #6   Title  Pt. will improve [facility] skills to be able to handle and count money/change.    Baseline  Eval: pt. is unable    Time  12    Period  Weeks    Status  New    Target Date  05/11/18      OT LONG TERM GOAL #7   Title  Pt. will increase left hand digit flexion ROM to be able to independently hold a small  object in the palm of her hand without dropping it.      Baseline  Eval: Limited ability to formulate a composite fist.    Time  12    Period  Weeks    Status  New    Target Date  05/11/18      OT LONG TERM GOAL #8   Title  Pt. will be independent wperforming haircare    Baseline  Eval: Pt. is unable to complete with her right UE.    Time  12    Period  Weeks    Status  New    Target Date  05/11/18            Plan - 03/23/18 1109    Clinical Impression Statement  Pt seen today - show increase grip and prehension strength in R more than L - her AROM in digits better in R than L - pt report some pain with putty in L hand - pt to get her  parafffin bath working at home to use prior to ROM  2x day - to increase AROM in L digits and increase grip - pt cont to have issues using  hands in cutting, opening packages, writing , cutting and carrying groceries , - pt has planned surgery next week - will contact me when she is ready to come back    Occupational Profile and client history currently impacting functional performance  Pt. is a retired Engineer, civil (consulting), and participates in quilting shows.    Occupational performance deficits (Please refer to evaluation for details):  ADL's;IADL's;Leisure;Social Participation    Rehab Potential  Good    OT Treatment/Interventions  Self-care/ADL training;DME and/or AE instruction;Patient/family education;Paraffin;Moist Heat;Energy conservation;Therapeutic activities;Passive range of motion    Clinical Decision Making  Several treatment options, min-mod task modification necessary    Consulted and Agree with Plan of Care  Patient       Patient will benefit from skilled therapeutic intervention in order to improve the following deficits and impairments:  Decreased balance, Impaired UE functional use, Decreased strength, Decreased range of motion, Decreased endurance, Decreased activity tolerance, Pain, Decreased knowledge of precautions, Decreased  coordination  Visit Diagnosis: Other lack of coordination  Muscle weakness (generalized)  Bilateral hand pain  Stiffness of left  hand, not elsewhere classified    Problem List There are no active problems to display for this patient.   Oletta Cohn OTR/L,CLT 03/23/2018, 11:15 AM  [place] [facility] PHYSICAL AND SPORTS MEDICINE [address] [place], [address] Phone: [phone]   Fax:  [phone]  Name: Andrea Blake MRN: [id number] Date of Birth: 12/11/1931

## 2018-03-23 NOTE — Patient Instructions (Signed)
Paraffin bath Tendon glides - blocked Opposition to all digits  10 reps  pain free Am and PM   Joint protection - hand out and review  AE education done- built up handles, spring loaded scissors, Penagain, [employer] , insulation tubing , ect  Hand out on modifications for cooking and kitchen activities

## 2018-12-25 ENCOUNTER — Other Ambulatory Visit: Payer: Self-pay | Admitting: Family Medicine

## 2018-12-25 ENCOUNTER — Other Ambulatory Visit: Payer: Self-pay | Admitting: Hematology and Oncology

## 2018-12-25 DIAGNOSIS — Z1231 Encounter for screening mammogram for malignant neoplasm of breast: Secondary | ICD-10-CM

## 2019-01-17 ENCOUNTER — Ambulatory Visit
Admission: RE | Admit: 2019-01-17 | Discharge: 2019-01-17 | Disposition: A | Discharge status: Home / Self Care | Payer: Medicare Other | Source: Ambulatory Visit | Attending: Family Medicine | Admitting: Family Medicine

## 2019-01-17 ENCOUNTER — Other Ambulatory Visit: Payer: Self-pay

## 2019-01-17 DIAGNOSIS — Z1231 Encounter for screening mammogram for malignant neoplasm of breast: Secondary | ICD-10-CM | POA: Diagnosis present

## 2020-06-30 ENCOUNTER — Other Ambulatory Visit: Payer: Self-pay | Admitting: Nurse Practitioner

## 2020-06-30 DIAGNOSIS — Z1231 Encounter for screening mammogram for malignant neoplasm of breast: Secondary | ICD-10-CM

## 2020-07-01 ENCOUNTER — Other Ambulatory Visit: Payer: Self-pay

## 2020-07-01 ENCOUNTER — Ambulatory Visit
Admission: RE | Admit: 2020-07-01 | Discharge: 2020-07-01 | Disposition: A | Discharge status: Home / Self Care | Payer: Medicare Other | Source: Ambulatory Visit | Attending: Nurse Practitioner | Admitting: Nurse Practitioner

## 2020-07-01 DIAGNOSIS — Z1231 Encounter for screening mammogram for malignant neoplasm of breast: Secondary | ICD-10-CM | POA: Diagnosis present

## 2021-01-07 ENCOUNTER — Encounter: Payer: Self-pay | Admitting: Physical Therapy

## 2021-01-07 ENCOUNTER — Other Ambulatory Visit: Payer: Self-pay

## 2021-01-07 ENCOUNTER — Ambulatory Visit: Payer: Medicare Other | Attending: Gastroenterology | Admitting: Physical Therapy

## 2021-01-07 DIAGNOSIS — R1030 Lower abdominal pain, unspecified: Secondary | ICD-10-CM | POA: Insufficient documentation

## 2021-01-07 DIAGNOSIS — R278 Other lack of coordination: Secondary | ICD-10-CM | POA: Diagnosis not present

## 2021-01-07 DIAGNOSIS — R293 Abnormal posture: Secondary | ICD-10-CM | POA: Diagnosis present

## 2021-01-07 NOTE — Therapy (Signed)
[facility] [address] [place], [place], [address] Phone: [phone]   Fax:  [phone]  Physical Therapy Evaluation  Patient Details  Name: Andrea Blake MRN: [id number] Date of Birth: April 18, 1931 Referring Provider (PT): Genene Churn, MD   Encounter Date: 01/07/2021   PT End of Session - 01/07/21 0956     Visit Number 1    Number of Visits 12    Date for PT Re-Evaluation 04/01/21    PT Start Time 0955    PT Stop Time 1030    PT Time Calculation (min) 35 min    Activity Tolerance Patient tolerated treatment well    Behavior During Therapy [facility] for tasks assessed/performed             Past Medical History:  Diagnosis Date   Arthritis    Diverticulitis    Hypertension     Past Surgical History:  Procedure Laterality Date   CATARACT EXTRACTION Bilateral    CHOLECYSTECTOMY     COLON SURGERY     EXCISION MORTON'S NEUROMA Left    HERNIA REPAIR     JOINT REPLACEMENT     ROTATOR CUFF REPAIR Right     There were no vitals filed for this visit.        [facility] PT Assessment - 01/07/21 0001       Assessment   Medical Diagnosis PFD    Referring Provider (PT) Genene Churn, MD    Hand Dominance Right    Next MD Visit 06/2021    Prior Therapy Yes      Balance Screen   Has the patient fallen in the past 6 months No             PELVIC HEALTH PHYSICAL THERAPY EVALUATION  SCREENING Red Flags: None Have you had any night sweats? Unexplained weight loss? Saddle anesthesia? Unexplained changes in bowel or bladder habits?  Precautions: bone density concerns  SUBJECTIVE  Chief Complaint: Patient notes that she has been having problems with UI and constipation for a very long time. Patient states that she saw a PT prior to shoulder replacement and participated in 3 visits; notes that she was recommended to take flaxseed supplement, squatty potty, and abdominal massage. Patient did not find  anything useful/helpful. Notes that she did not continue abdominal massage at home. Patient uses rocking technique to get started with BM. Patient notes no urge to defecate, but does routinely attempt. Patient notes high fiber diet and adequate hydration (5-6 x 8-10 oz glasses). Patient has also initiated both pre and probiotic supplementation. Patient notes that she does have lichens sclerosus and is monitored by dermatology. Patient reports that she was told at last appointment that she has some scarring near the urethra. Patient notes incomplete bowel emptying; she reports small BMs 3x/day. Every once in a while (once/week roughly) feels complete emptying. Notes that combined 3x.daily does not equate complete BM. Patient notes some urgency with defecation in the middle of the night; loose stool 3x. The loose stools are preceded by bedtime BM that is typically Type 1 Bristol Stool chart.  Patient notes last colonoscopy several years ago (chart review suggests last colonoscopy . Patient has also lost 1.5" in height recently; patient has also lost 4 " total in height.  Pertinent History:  Surgical history: Positive for see above.   Recent Procedures/Tests/Findings: 05/08/2020 ARM Results as noted in chart: "Impression:- Resting study reveals a normal internal anal sphincter pressure. Squeeze  study reveals a normal external anal sphincter pressure. RAIR (Rectoanal Inhibitory Reflex) is present suggesting absence of Hirschsprung's disease. Sensation study reveals a normal first sensation threshold with normal urge and maximal tolerated volume thresholds. Able to expel defecation balloon. Low maximal squeeze activity on EMG. Strain manuever reveals an increase in pelvic floor activity with strain."    Urinary History: Incontinence: Positive. Onset: ~2 years Triggers: on the way to the toilet (80%); sneezing (50%). Amount: Min/Mod.  Protective undergarments: Yes  Type: toilet tissue in underwear  Number  used/day: only used during waking hours Fluid Intake: 5-6x 10 oz H20, black tea and green tea caffeinated (2 bags), rare glass of wine Nocturia: 2-3x/night Frequency of urination: every 2-8 hours Pain with urination: Negative Difficulty initiating urination: Negative Intermittent stream: Negative Frequent UTI: Negative.   Gastrointestinal History: Bristol Stool Chart: Type 1-2 Frequency of BMs: 3x/day (incomplete and small) Pain with defecation: Positive for lower abdominal pain after bowel movement. Straining with defecation: Positive for most. Hemorrhoids: Positive ; external; latent Toileting posture: feet dangling Incontinence: Positive for history.   OBJECTIVE  Mental Status Patient is oriented to person, place and time.  Recent memory is intact.  Remote memory is intact.  Attention span and concentration are intact.  Expressive speech is intact.  Patient's fund of knowledge is within normal limits for educational level.  POSTURE/OBSERVATIONS:  Thoracic kyphosis: increased Iliac crest height: pronounced asymmetry  Pelvic obliquity: apparent on observation, requires palpation to determine extent and direction  GAIT: Wide based gait with lilting L>R. B Trendelenburg during ambulation to and from waiting area.  RANGE OF MOTION: deferred 2/2 to time constraints   LEFT RIGHT  Lumbar forward flexion (65):      Lumbar extension (30):     Lumbar lateral flexion (25):     Thoracic and Lumbar rotation (30 degrees):       Hip Flexion (0-125):      Hip IR (0-45):     Hip ER (0-45):     Hip Abduction (0-40):     Hip extension (0-15):        STRENGTH: MMT deferred 2/2 to time constraints  RLE LLE  Hip Flexion    Hip Extension    Hip Abduction     Hip Adduction     Hip ER     Hip IR     Knee Extension    Knee Flexion    Dorsiflexion     Plantarflexion (seated)     ABDOMINAL: deferred 2/2 to time constraints Palpation: Diastasis: Scar mobility: Rib  flare:  SPECIAL TESTS: deferred 2/2 to time constraints  PHYSICAL PERFORMANCE MEASURES: STS: able to perform without use of UE  EXTERNAL PELVIC EXAM: deferred 2/2 to time constraints Palpation: Breath coordination: Voluntary Contraction: present/absent Relaxation: full/delayed/non-relaxing Perineal movement with sustained IAP increase ("bear down"): descent/no change/elevation/excessive descent Perineal movement with rapid IAP increase ("cough"): elevation/no change/descent  INTERNAL VAGINAL EXAM: deferred 2/2 to time constraints Introitus Appears:  Skin integrity:  Scar mobility: Strength (PERF):  Symmetry: Palpation: Prolapse:   INTERNAL RECTAL EXAM: deferred 2/2 to time constraints Strength (PERF): Symmetry: Palpation: Prolapse:   OUTCOME MEASURES: FOTO (Urinary 43; Bowel Constipation 49)   ASSESSMENT Patient is an 87 year old presenting to clinic with chief complaints of bowel and bladder dysfunction. Today's evaluation is suggestive of deficits in PFM coordination, PFM strength, posture, and gait as evidenced by B Trendelenburg sign during ambulation, significant pelvic and iliac crest asymmetry in standing, straining with every BM,  sensation of incomplete emptying of bowel, history of FI, urge urinary incontinence several times a day. Patient's responses on FOTO outcome measures (Urinary 43 and Bowel Constipation 49) indicate significant functional limitations/disability/distress. Patient's progress may be limited due to persistence of complaint and history of nonadherence to pelvic physical therapy; however, patient's interest and attendance is advantageous. Patient was able to achieve basic understanding of PFM functions during today's evaluation and responded positively to educational interventions. Patient will benefit from continued skilled therapeutic intervention to address deficits in PFM coordination, PFM strength, posture, and gait in order to increase function and  improve overall QOL.  EDUCATION Patient educated on prognosis, POC, and provided with HEP including: not initiated. Patient articulated understanding and returned demonstration. Patient will benefit from further education in order to maximize compliance and understanding for long-term therapeutic gains.  TREATMENT Neuromuscular Re-education: Patient educated on primary functions of the pelvic floor including: posture/balance, sexual pleasure, storage and elimination of waste from the body, abdominal cavity closure, and breath coordination.    Objective measurements completed on examination: See above findings.        PT Long Term Goals - 01/07/21 1708       PT LONG TERM GOAL #1   Title Patient will demonstrate independence with HEP in order to maximize therapeutic gains and improve carryover from physical therapy sessions to ADLs in the home and community.    Baseline IE: not initiated    Time 12    Period Weeks    Status New    Target Date 04/01/21      PT LONG TERM GOAL #2   Title Patient will demonstrate improved function as evidenced by a score of >51 on FOTO Urinary measure for full participation in activities at home and in the community.    Baseline IE: 43    Time 12    Period Weeks    Status New    Target Date 04/01/21      PT LONG TERM GOAL #3   Title Patient will demonstrate improved function as evidenced by a score of >56 on FOTO Constipation measure for full participation in activities at home and in the community.    Baseline IE: 49    Time 12    Period Weeks    Status New    Target Date 04/01/21      PT LONG TERM GOAL #4   Title Patient will demonstrate sustained eccentric contraction of PFM for > 15 seconds duration with continuous breathing strategy in order to improve bowel emptying and decrease risk of dangerous IAP (risk of spinal fracture and pelvic organ prolapse).    Baseline IE: not demonstrated    Time 12    Period Weeks    Status New     Target Date 04/01/21      PT LONG TERM GOAL #5   Title Patient will report urge incontinence occurrence of "once or less a week" or "more than once a week" for improved participation at home and in the community.    Baseline IE: "several times a day"    Time 12    Period Weeks    Status New    Target Date 04/01/21                    Plan - 01/07/21 1641     Clinical Impression Statement Patient is an 87 year old presenting to clinic with chief complaints of bowel and bladder dysfunction. Today's evaluation is suggestive  of deficits in PFM coordination, PFM strength, posture, and gait as evidenced by B Trendelenburg sign during ambulation, significant pelvic and iliac crest asymmetry in standing, straining with every BM, sensation of incomplete emptying of bowel, history of FI, urge urinary incontinence several times a day. Patient's responses on FOTO outcome measures (Urinary 43 and Bowel Constipation 49) indicate significant functional limitations/disability/distress. Patient's progress may be limited due to persistence of complaint and history of nonadherence to pelvic physical therapy; however, patient's interest and attendance is advantageous. Patient was able to achieve basic understanding of PFM functions during today's evaluation and responded positively to educational interventions. Patient will benefit from continued skilled therapeutic intervention to address deficits in PFM coordination, PFM strength, posture, and gait in order to increase function and improve overall QOL.    Personal Factors and Comorbidities Comorbidity 3+;Age;Behavior Pattern;Past/Current Experience;Time since onset of injury/illness/exacerbation    Comorbidities lichen sclerosus, insomnia, dyspnea, GERD, aquired hypothyroidism, chronic back pain, HTN    Examination-Activity Limitations Continence;Toileting    Examination-Participation Restrictions Community Activity;Meal Prep;Laundry    Stability/Clinical  Decision Making Evolving/Moderate complexity    Clinical Decision Making Moderate    Rehab Potential Fair    PT Frequency 1x / week    PT Duration 12 weeks    PT Treatment/Interventions ADLs/Self Care Home Management;Biofeedback;Cryotherapy;Electrical Stimulation;Moist Heat;Therapeutic exercise;Neuromuscular re-education;Patient/family education;Manual techniques;Taping;Therapeutic activities;Orthotic Fit/Training    PT Next Visit Plan physical assessment, external PFM assessment    PT Home Exercise Plan not initiated    Consulted and Agree with Plan of Care Patient             Patient will benefit from skilled therapeutic intervention in order to improve the following deficits and impairments:  Abnormal gait, Postural dysfunction, Decreased strength, Improper body mechanics, Pain, Decreased endurance, Decreased coordination  Visit Diagnosis: Other lack of coordination  Lower abdominal pain  Abnormal posture     Problem List There are no problems to display for this patient.   Sheria Lang PT, DPT [phone]  01/07/2021, 5:11 PM  [employer] [facility] [address] [place], [place], [address] Phone: [phone]   Fax:  [phone]  Name: Xiomara Sevillano MRN: [id number] Date of Birth: 1932/02/13

## 2021-01-14 ENCOUNTER — Encounter: Payer: Medicare Other | Admitting: Physical Therapy

## 2021-01-21 ENCOUNTER — Other Ambulatory Visit: Payer: Self-pay

## 2021-01-21 ENCOUNTER — Ambulatory Visit: Payer: Medicare Other | Attending: Gastroenterology | Admitting: Physical Therapy

## 2021-01-21 ENCOUNTER — Encounter: Payer: Self-pay | Admitting: Physical Therapy

## 2021-01-21 DIAGNOSIS — R278 Other lack of coordination: Secondary | ICD-10-CM | POA: Insufficient documentation

## 2021-01-21 DIAGNOSIS — R1030 Lower abdominal pain, unspecified: Secondary | ICD-10-CM | POA: Diagnosis present

## 2021-01-21 DIAGNOSIS — R293 Abnormal posture: Secondary | ICD-10-CM | POA: Insufficient documentation

## 2021-01-21 NOTE — Therapy (Signed)
[facility] [address] [place], [place], [address] Phone: [phone]   Fax:  [phone]  Physical Therapy Treatment  Patient Details  Name: Andrea Blake MRN: [id number] Date of Birth: [birth year] Referring Provider (PT): Genene Churn, MD   Encounter Date: 01/21/2021   PT End of Session - 01/21/21 0954     Visit Number 2    Number of Visits 12    Date for PT Re-Evaluation 04/01/21    PT Start Time 0951    PT Stop Time 1030    PT Time Calculation (min) 39 min    Activity Tolerance Patient tolerated treatment well    Behavior During Therapy [facility] for tasks assessed/performed             Past Medical History:  Diagnosis Date   Arthritis    Diverticulitis    Hypertension     Past Surgical History:  Procedure Laterality Date   CATARACT EXTRACTION Bilateral    CHOLECYSTECTOMY     COLON SURGERY     EXCISION MORTON'S NEUROMA Left    HERNIA REPAIR     JOINT REPLACEMENT     ROTATOR CUFF REPAIR Right     There were no vitals filed for this visit.   Subjective Assessment - 01/21/21 0953     Subjective Patient notes that she had 1 episode of FI during night with urgency. Patient notes liquid stool passed. This was the first occurrence of its kind in several months. Patient denies complete loss. Notes continued occasional occurrence of lower abdominal pain/discomfort.    Currently in Pain? No/denies             TREATMENT  Pre-treatment assessment: ABDOMINAL:  Palpation: mild TTP of LLQ, relieved by MFR technique of colic flexure Diastasis: roughly 3 fingers throughout Rib flare: none noted  EXTERNAL PELVIC EXAM: Patient educated on the purpose of the pelvic exam and articulated understanding; patient consented to the exam verbally. Palpation: no TTP noted Breath coordination: present Cued Contraction: 1/5 MMT, able ot repeat for 3 repetitions, initial test revealed significant gluteal and adductor  compensations  Neuromuscular Re-education: Supine hooklying diaphragmatic breathing with VCs and TCs for downregulation of the nervous system and improved management of IAP Supine hooklying, PFM contractions (x3) with exhalation. VCs and TCs to decrease compensatory patterns and encourage activation of the PFM. Seated PFM contractions (x3) with exhalation. VCs and TCs to decrease compensatory patterns and encourage activation of the PFM. Seated postural correction, thoracic extension over ball with coordinated breath, x5. Patient had increased R flank pain as a result. Seated rib shifts (lateral) for improved postural symmetry, x3 each side. Exacerbated R side flank pain. Seated lateral trunk flexion for improved postural symmetry. Exacerbated R side flank pain. Patient education on impact of posture on pressure management with visual aids. Patient articulated understanding.  Treatments unbilled: MHP to lumbar region in sitting during patient education on HEP.   Patient educated throughout session on appropriate technique and form using multi-modal cueing, HEP, and activity modification. Patient articulated understanding and returned demonstration.  Patient Response to interventions: R LBP resolved with MHP.  ASSESSMENT Patient presents to clinic with excellent motivation to participate in therapy. Patient demonstrates deficits in PFM coordination, PFM strength, posture, and gait. Patient able to achieve isolated PFM contraction during today's session and responded positively to educational interventions. Patient will benefit from continued skilled therapeutic intervention to address remaining deficits in PFM coordination, PFM strength, posture, and gait in  order to increase function, and improve overall QOL.     PT Long Term Goals - 01/07/21 1708       PT LONG TERM GOAL #1   Title Patient will demonstrate independence with HEP in order to maximize therapeutic gains and improve carryover  from physical therapy sessions to ADLs in the home and community.    Baseline IE: not initiated    Time 12    Period Weeks    Status New    Target Date 04/01/21      PT LONG TERM GOAL #2   Title Patient will demonstrate improved function as evidenced by a score of >51 on FOTO Urinary measure for full participation in activities at home and in the community.    Baseline IE: 43    Time 12    Period Weeks    Status New    Target Date 04/01/21      PT LONG TERM GOAL #3   Title Patient will demonstrate improved function as evidenced by a score of >56 on FOTO Constipation measure for full participation in activities at home and in the community.    Baseline IE: 49    Time 12    Period Weeks    Status New    Target Date 04/01/21      PT LONG TERM GOAL #4   Title Patient will demonstrate sustained eccentric contraction of PFM for > 15 seconds duration with continuous breathing strategy in order to improve bowel emptying and decrease risk of dangerous IAP (risk of spinal fracture and pelvic organ prolapse).    Baseline IE: not demonstrated    Time 12    Period Weeks    Status New    Target Date 04/01/21      PT LONG TERM GOAL #5   Title Patient will report urge incontinence occurrence of "once or less a week" or "more than once a week" for improved participation at home and in the community.    Baseline IE: "several times a day"    Time 12    Period Weeks    Status New    Target Date 04/01/21                   Plan - 01/21/21 0954     Clinical Impression Statement Patient presents to clinic with excellent motivation to participate in therapy. Patient demonstrates deficits in PFM coordination, PFM strength, posture, and gait. Patient able to achieve isolated PFM contraction during today's session and responded positively to educational interventions. Patient will benefit from continued skilled therapeutic intervention to address remaining deficits in PFM coordination, PFM  strength, posture, and gait in order to increase function, and improve overall QOL.    Personal Factors and Comorbidities Comorbidity 3+;Age;Behavior Pattern;Past/Current Experience;Time since onset of injury/illness/exacerbation    Comorbidities lichen sclerosus, insomnia, dyspnea, GERD, aquired hypothyroidism, chronic back pain, HTN    Examination-Activity Limitations Continence;Toileting    Examination-Participation Restrictions Community Activity;Meal Prep;Laundry    Stability/Clinical Decision Making Evolving/Moderate complexity    Rehab Potential Fair    PT Frequency 1x / week    PT Duration 12 weeks    PT Treatment/Interventions ADLs/Self Care Home Management;Biofeedback;Cryotherapy;Electrical Stimulation;Moist Heat;Therapeutic exercise;Neuromuscular re-education;Patient/family education;Manual techniques;Taping;Therapeutic activities;Orthotic Fit/Training    PT Next Visit Plan deep core training    PT Home Exercise Plan 3x quick flicks, thoracic extension seated    Consulted and Agree with Plan of Care Patient  Patient will benefit from skilled therapeutic intervention in order to improve the following deficits and impairments:  Abnormal gait, Postural dysfunction, Decreased strength, Improper body mechanics, Pain, Decreased endurance, Decreased coordination  Visit Diagnosis: Other lack of coordination  Lower abdominal pain  Abnormal posture     Problem List There are no problems to display for this patient.   Sheria Lang PT, DPT [phone]  01/21/2021, 5:19 PM  [facility] [address] [place], [place], [address] Phone: [phone]   Fax:  [phone]  Name: Dalana Pfahler MRN: [id number] Date of Birth: 30-May-1931

## 2021-01-28 ENCOUNTER — Encounter: Payer: Self-pay | Admitting: Physical Therapy

## 2021-01-28 ENCOUNTER — Ambulatory Visit: Payer: Medicare Other | Admitting: Physical Therapy

## 2021-01-28 ENCOUNTER — Other Ambulatory Visit: Payer: Self-pay

## 2021-01-28 DIAGNOSIS — R293 Abnormal posture: Secondary | ICD-10-CM

## 2021-01-28 DIAGNOSIS — R278 Other lack of coordination: Secondary | ICD-10-CM

## 2021-01-28 DIAGNOSIS — R1030 Lower abdominal pain, unspecified: Secondary | ICD-10-CM

## 2021-01-28 NOTE — Therapy (Signed)
[facility] [address] [place], [place], [address] Phone: [phone]   Fax:  [phone]  Physical Therapy Treatment  Patient Details  Name: Andrea Blake MRN: [id number] Date of Birth: [birth year] Referring Provider (PT): Genene Churn, MD   Encounter Date: 01/28/2021   PT End of Session - 01/28/21 1320     Visit Number 3    Number of Visits 12    Date for PT Re-Evaluation 04/01/21    Authorization Type 01/07/2021 IE    PT Start Time 0945    PT Stop Time 1025    PT Time Calculation (min) 40 min    Activity Tolerance Patient tolerated treatment well    Behavior During Therapy [facility] for tasks assessed/performed             Past Medical History:  Diagnosis Date   Arthritis    Diverticulitis    Hypertension     Past Surgical History:  Procedure Laterality Date   CATARACT EXTRACTION Bilateral    CHOLECYSTECTOMY     COLON SURGERY     EXCISION MORTON'S NEUROMA Left    HERNIA REPAIR     JOINT REPLACEMENT     ROTATOR CUFF REPAIR Right     There were no vitals filed for this visit.   Subjective Assessment - 01/28/21 0948     Subjective Patient reports that she has been doing exercises and is unsure if she is doing the postural intervention correctly. Patient has noticed that between getting dressed in the AM and then does not have to empty until after lunch (~ 6 hours). At this point, the frequency increases to every 2 hours when urgency increases to a point where she has leakage. Patient continues to wake 2-3x/night.    Currently in Pain? No/denies            TREATMENT  Pre-treatment assessment: ABDOMINAL:  Palpation: mild TTP of LLQ, relieved by MFR technique of colic flexure Diastasis: roughly 3 fingers throughout Rib flare: none noted  EXTERNAL PELVIC EXAM: Patient educated on the purpose of the pelvic exam and articulated understanding; patient consented to the exam verbally. Palpation: no  TTP noted Breath coordination: present Cued Contraction: 1/5 MMT, able ot repeat for 3 repetitions, initial test revealed significant gluteal and adductor compensations  Neuromuscular Re-education: Patient educated on urge suppression strategies and techniques as well as typical bladder-brain communication. Seated postural correction, thoracic extension over ball with coordinated breath, x5. Seated mermaid stretch, B, with cueing for sequencing and prevention of compensatory mechanisms.   Patient educated throughout session on appropriate technique and form using multi-modal cueing, HEP, and activity modification. Patient articulated understanding and returned demonstration.  Patient Response to interventions: Agrees to 2x mermaid stretch addition to HEP  ASSESSMENT Patient presents to clinic with excellent motivation to participate in therapy. Patient demonstrates deficits in PFM coordination, PFM strength, posture, and gait. Patient participated in education on typical bladder-brain communication and urge suppression strategies during today's session and responded positively to active interventions. Patient will benefit from continued skilled therapeutic intervention to address remaining deficits in PFM coordination, PFM strength, posture, and gait in order to increase function, and improve overall QOL.   PT Long Term Goals - 01/07/21 1708       PT LONG TERM GOAL #1   Title Patient will demonstrate independence with HEP in order to maximize therapeutic gains and improve carryover from physical therapy sessions to ADLs in the home and community.  Baseline IE: not initiated    Time 12    Period Weeks    Status New    Target Date 04/01/21      PT LONG TERM GOAL #2   Title Patient will demonstrate improved function as evidenced by a score of >51 on FOTO Urinary measure for full participation in activities at home and in the community.    Baseline IE: 43    Time 12    Period Weeks     Status New    Target Date 04/01/21      PT LONG TERM GOAL #3   Title Patient will demonstrate improved function as evidenced by a score of >56 on FOTO Constipation measure for full participation in activities at home and in the community.    Baseline IE: 49    Time 12    Period Weeks    Status New    Target Date 04/01/21      PT LONG TERM GOAL #4   Title Patient will demonstrate sustained eccentric contraction of PFM for > 15 seconds duration with continuous breathing strategy in order to improve bowel emptying and decrease risk of dangerous IAP (risk of spinal fracture and pelvic organ prolapse).    Baseline IE: not demonstrated    Time 12    Period Weeks    Status New    Target Date 04/01/21      PT LONG TERM GOAL #5   Title Patient will report urge incontinence occurrence of "once or less a week" or "more than once a week" for improved participation at home and in the community.    Baseline IE: "several times a day"    Time 12    Period Weeks    Status New    Target Date 04/01/21                   Plan - 01/28/21 1321     Clinical Impression Statement Patient presents to clinic with excellent motivation to participate in therapy. Patient demonstrates deficits in PFM coordination, PFM strength, posture, and gait. Patient participated in education on typical bladder-brain communication and urge suppression strategies during today's session and responded positively to active interventions. Patient will benefit from continued skilled therapeutic intervention to address remaining deficits in PFM coordination, PFM strength, posture, and gait in order to increase function, and improve overall QOL.    Personal Factors and Comorbidities Comorbidity 3+;Age;Behavior Pattern;Past/Current Experience;Time since onset of injury/illness/exacerbation    Comorbidities lichen sclerosus, insomnia, dyspnea, GERD, aquired hypothyroidism, chronic back pain, HTN    Examination-Activity  Limitations Continence;Toileting    Examination-Participation Restrictions Community Activity;Meal Prep;Laundry    Stability/Clinical Decision Making Evolving/Moderate complexity    Rehab Potential Fair    PT Frequency 1x / week    PT Duration 12 weeks    PT Treatment/Interventions ADLs/Self Care Home Management;Biofeedback;Cryotherapy;Electrical Stimulation;Moist Heat;Therapeutic exercise;Neuromuscular re-education;Patient/family education;Manual techniques;Taping;Therapeutic activities;Orthotic Fit/Training    PT Next Visit Plan R hip manual, deep core training    PT Home Exercise Plan 3x quick flicks, thoracic extension seated    Consulted and Agree with Plan of Care Patient             Patient will benefit from skilled therapeutic intervention in order to improve the following deficits and impairments:  Abnormal gait, Postural dysfunction, Decreased strength, Improper body mechanics, Pain, Decreased endurance, Decreased coordination  Visit Diagnosis: Other lack of coordination  Lower abdominal pain  Abnormal posture     Problem List  There are no problems to display for this patient.   Sheria Lang PT, DPT [phone]  01/28/2021, 1:25 PM  [facility] [address] [place], [place], [address] Phone: [phone]   Fax:  [phone]  Name: Andrea Blake MRN: [id number] Date of Birth: Oct 09, 1931

## 2021-02-04 ENCOUNTER — Ambulatory Visit: Payer: Medicare Other | Admitting: Physical Therapy

## 2021-02-04 ENCOUNTER — Encounter: Payer: Self-pay | Admitting: Physical Therapy

## 2021-02-04 ENCOUNTER — Other Ambulatory Visit: Payer: Self-pay

## 2021-02-04 DIAGNOSIS — R278 Other lack of coordination: Secondary | ICD-10-CM | POA: Diagnosis not present

## 2021-02-04 DIAGNOSIS — R1030 Lower abdominal pain, unspecified: Secondary | ICD-10-CM

## 2021-02-04 DIAGNOSIS — R293 Abnormal posture: Secondary | ICD-10-CM

## 2021-02-04 NOTE — Therapy (Signed)
[facility] [address] [place], [place], [address] Phone: [phone]   Fax:  [phone]  Physical Therapy Treatment  Patient Details  Name: Andrea Blake MRN: [id number] Date of Birth: 1932/01/01 Referring Provider (PT): Genene Churn, MD   Encounter Date: 02/04/2021   PT End of Session - 02/04/21 1743     Visit Number 4    Number of Visits 12    Date for PT Re-Evaluation 04/01/21    Authorization Type 01/07/2021 IE    PT Start Time 0945    PT Stop Time 1025    PT Time Calculation (min) 40 min    Activity Tolerance Patient tolerated treatment well    Behavior During Therapy [facility] for tasks assessed/performed             Past Medical History:  Diagnosis Date   Arthritis    Diverticulitis    Hypertension     Past Surgical History:  Procedure Laterality Date   CATARACT EXTRACTION Bilateral    CHOLECYSTECTOMY     COLON SURGERY     EXCISION MORTON'S NEUROMA Left    HERNIA REPAIR     JOINT REPLACEMENT     ROTATOR CUFF REPAIR Right     There were no vitals filed for this visit.   Subjective Assessment - 02/04/21 0946     Subjective Patient notes that she is doing much better. She is able to suppress urge to make it to the restroom when waking up at night and during the day. Patient does note that she is still having some GI problems. Patient had to get up in the middle of the night to empty bowels and then had 2 more BMs with the 3rd being watery. This was followed by a day without bowel movement; patient administered laxative which helped. Then a day with loose BM.    Currently in Pain? No/denies             TREATMENT Manual Therapy: STM and TPR performed to R hip and gluteal mm to allow for decreased tension and pain and improved posture and function Sacral mobilizations for decreased spasm and improved mobility, grade II/III  Neuromuscular Re-education: Supine hooklying and seated  diaphragmatic breathing with VCs and TCs for downregulation of the nervous system and improved management of IAP Supine hooklying and seated, TrA activation with exhalation . VCs and TCs to decrease compensatory patterns and minimize aggravation of the lumbar paraspinals.    Patient educated throughout session on appropriate technique and form using multi-modal cueing, HEP, and activity modification. Patient articulated understanding and returned demonstration.  Patient Response to interventions: Does not report increased pain.  ASSESSMENT Patient presents to clinic with excellent motivation to participate in therapy. Patient demonstrates deficits in PFM coordination, PFM strength, posture, and gait. Patient able to coordinate diaphragmatic breathing in supine with most consistency during today's session and responded positively to active interventions. Patient will benefit from continued skilled therapeutic intervention to address remaining deficits in PFM coordination, PFM strength, posture, and gait in order to increase function, and improve overall QOL.    PT Long Term Goals - 01/07/21 1708       PT LONG TERM GOAL #1   Title Patient will demonstrate independence with HEP in order to maximize therapeutic gains and improve carryover from physical therapy sessions to ADLs in the home and community.    Baseline IE: not initiated    Time 12    Period Weeks  Status New    Target Date 04/01/21      PT LONG TERM GOAL #2   Title Patient will demonstrate improved function as evidenced by a score of >51 on FOTO Urinary measure for full participation in activities at home and in the community.    Baseline IE: 43    Time 12    Period Weeks    Status New    Target Date 04/01/21      PT LONG TERM GOAL #3   Title Patient will demonstrate improved function as evidenced by a score of >56 on FOTO Constipation measure for full participation in activities at home and in the community.    Baseline  IE: 49    Time 12    Period Weeks    Status New    Target Date 04/01/21      PT LONG TERM GOAL #4   Title Patient will demonstrate sustained eccentric contraction of PFM for > 15 seconds duration with continuous breathing strategy in order to improve bowel emptying and decrease risk of dangerous IAP (risk of spinal fracture and pelvic organ prolapse).    Baseline IE: not demonstrated    Time 12    Period Weeks    Status New    Target Date 04/01/21      PT LONG TERM GOAL #5   Title Patient will report urge incontinence occurrence of "once or less a week" or "more than once a week" for improved participation at home and in the community.    Baseline IE: "several times a day"    Time 12    Period Weeks    Status New    Target Date 04/01/21                   Plan - 02/04/21 1732     Clinical Impression Statement Patient presents to clinic with excellent motivation to participate in therapy. Patient demonstrates deficits in PFM coordination, PFM strength, posture, and gait. Patient able to coordinate diaphragmatic breathing in supine with most consistency during today's session and responded positively to active interventions. Patient will benefit from continued skilled therapeutic intervention to address remaining deficits in PFM coordination, PFM strength, posture, and gait in order to increase function, and improve overall QOL.    Personal Factors and Comorbidities Comorbidity 3+;Age;Behavior Pattern;Past/Current Experience;Time since onset of injury/illness/exacerbation    Comorbidities lichen sclerosus, insomnia, dyspnea, GERD, aquired hypothyroidism, chronic back pain, HTN    Examination-Activity Limitations Continence;Toileting    Examination-Participation Restrictions Community Activity;Meal Prep;Laundry    Stability/Clinical Decision Making Evolving/Moderate complexity    Rehab Potential Fair    PT Frequency 1x / week    PT Duration 12 weeks    PT  Treatment/Interventions ADLs/Self Care Home Management;Biofeedback;Cryotherapy;Electrical Stimulation;Moist Heat;Therapeutic exercise;Neuromuscular re-education;Patient/family education;Manual techniques;Taping;Therapeutic activities;Orthotic Fit/Training    PT Next Visit Plan R hip manual, deep core training    PT Home Exercise Plan 3x quick flicks, thoracic extension seated    Consulted and Agree with Plan of Care Patient             Patient will benefit from skilled therapeutic intervention in order to improve the following deficits and impairments:  Abnormal gait, Postural dysfunction, Decreased strength, Improper body mechanics, Pain, Decreased endurance, Decreased coordination  Visit Diagnosis: Other lack of coordination  Lower abdominal pain  Abnormal posture     Problem List There are no problems to display for this patient.   Sheria Lang PT, DPT [phone]  02/04/2021,  5:50 PM  [facility] [address] [place], [place], [address] Phone: [phone]   Fax:  [phone]  Name: Andrea Blake MRN: [id number] Date of Birth: 12-20-1931

## 2021-02-11 ENCOUNTER — Ambulatory Visit: Payer: Medicare Other | Admitting: Physical Therapy

## 2021-02-11 ENCOUNTER — Other Ambulatory Visit: Payer: Self-pay

## 2021-02-11 ENCOUNTER — Encounter: Payer: Self-pay | Admitting: Physical Therapy

## 2021-02-11 DIAGNOSIS — R293 Abnormal posture: Secondary | ICD-10-CM

## 2021-02-11 DIAGNOSIS — R278 Other lack of coordination: Secondary | ICD-10-CM | POA: Diagnosis not present

## 2021-02-11 DIAGNOSIS — R1030 Lower abdominal pain, unspecified: Secondary | ICD-10-CM

## 2021-02-11 NOTE — Therapy (Signed)
[place] [facility] [address] [place], [place], [address] Phone: [phone]   Fax:  [phone]  Physical Therapy Treatment  Patient Details  Name: Andrea Blake MRN: [id number] Date of Birth: 05-15-1931 Referring Provider (PT): Genene Churn, MD   Encounter Date: 02/11/2021   PT End of Session - 02/11/21 0955     Visit Number 5    Number of Visits 12    Date for PT Re-Evaluation 04/01/21    Authorization Type 01/07/2021 IE    PT Start Time 0948    PT Stop Time 1030    PT Time Calculation (min) 42 min    Activity Tolerance Patient tolerated treatment well    Behavior During Therapy [facility] for tasks assessed/performed             Past Medical History:  Diagnosis Date   Arthritis    Diverticulitis    Hypertension     Past Surgical History:  Procedure Laterality Date   CATARACT EXTRACTION Bilateral    CHOLECYSTECTOMY     COLON SURGERY     EXCISION MORTON'S NEUROMA Left    HERNIA REPAIR     JOINT REPLACEMENT     ROTATOR CUFF REPAIR Right     There were no vitals filed for this visit.   Subjective Assessment - 02/11/21 0949     Subjective Patient reports that she didn't notice any significant improvement in R hip after manual last session, but did notice certain positions with sitting are aggravating. Patient notes that she has had one instance of UI with nocturia which was moderate loss. Patient notes an awful time with constipation yesterday; administered natural laxative at 430P and 1030P. Patient also used abdominal massage. Patient has had 2 BMs this morning.    Currently in Pain? No/denies             TREATMENT Manual Therapy: STM and TPR performed to R hip and gluteal mm to allow for decreased tension and pain and improved posture and function Sacral mobilizations for decreased spasm and improved mobility, grade II/III Sacral border mobilizations with movement (hip extension and hip ER) for  improved mobility and decreased pain MFR of sacral ligaments for improved mobility and posture   Patient educated throughout session on appropriate technique and form using multi-modal cueing, HEP, and activity modification. Patient articulated understanding and returned demonstration.  Patient Response to interventions: Does not report increased pain; notes tenderness proximal to ischial tuberosity that resolved to some degree with MFR.  ASSESSMENT Patient presents to clinic with excellent motivation to participate in therapy. Patient demonstrates deficits in PFM coordination, PFM strength, posture, and gait. Patient with notable increased tension and tenderness in structures surrounding sacral border on R during today's session and responded positively to manual interventions. Patient will benefit from continued skilled therapeutic intervention to address remaining deficits in PFM coordination, PFM strength, posture, and gait in order to increase function, and improve overall QOL.   PT Long Term Goals - 01/07/21 1708       PT LONG TERM GOAL #1   Title Patient will demonstrate independence with HEP in order to maximize therapeutic gains and improve carryover from physical therapy sessions to ADLs in the home and community.    Baseline IE: not initiated    Time 12    Period Weeks    Status New    Target Date 04/01/21      PT LONG TERM GOAL #2   Title Patient  will demonstrate improved function as evidenced by a score of >51 on FOTO Urinary measure for full participation in activities at home and in the community.    Baseline IE: 43    Time 12    Period Weeks    Status New    Target Date 04/01/21      PT LONG TERM GOAL #3   Title Patient will demonstrate improved function as evidenced by a score of >56 on FOTO Constipation measure for full participation in activities at home and in the community.    Baseline IE: 49    Time 12    Period Weeks    Status New    Target Date 04/01/21       PT LONG TERM GOAL #4   Title Patient will demonstrate sustained eccentric contraction of PFM for > 15 seconds duration with continuous breathing strategy in order to improve bowel emptying and decrease risk of dangerous IAP (risk of spinal fracture and pelvic organ prolapse).    Baseline IE: not demonstrated    Time 12    Period Weeks    Status New    Target Date 04/01/21      PT LONG TERM GOAL #5   Title Patient will report urge incontinence occurrence of "once or less a week" or "more than once a week" for improved participation at home and in the community.    Baseline IE: "several times a day"    Time 12    Period Weeks    Status New    Target Date 04/01/21                   Plan - 02/11/21 0955     Clinical Impression Statement Patient presents to clinic with excellent motivation to participate in therapy. Patient demonstrates deficits in PFM coordination, PFM strength, posture, and gait. Patient with notable increased tension and tenderness in structures surrounding sacral border on R during today's session and responded positively to manual interventions. Patient will benefit from continued skilled therapeutic intervention to address remaining deficits in PFM coordination, PFM strength, posture, and gait in order to increase function, and improve overall QOL.    Personal Factors and Comorbidities Comorbidity 3+;Age;Behavior Pattern;Past/Current Experience;Time since onset of injury/illness/exacerbation    Comorbidities lichen sclerosus, insomnia, dyspnea, GERD, aquired hypothyroidism, chronic back pain, HTN    Examination-Activity Limitations Continence;Toileting    Examination-Participation Restrictions Community Activity;Meal Prep;Laundry    Stability/Clinical Decision Making Evolving/Moderate complexity    Rehab Potential Fair    PT Frequency 1x / week    PT Duration 12 weeks    PT Treatment/Interventions ADLs/Self Care Home  Management;Biofeedback;Cryotherapy;Electrical Stimulation;Moist Heat;Therapeutic exercise;Neuromuscular re-education;Patient/family education;Manual techniques;Taping;Therapeutic activities;Orthotic Fit/Training    PT Next Visit Plan R hip manual, deep core training    PT Home Exercise Plan 3x quick flicks, thoracic extension seated    Consulted and Agree with Plan of Care Patient             Patient will benefit from skilled therapeutic intervention in order to improve the following deficits and impairments:  Abnormal gait, Postural dysfunction, Decreased strength, Improper body mechanics, Pain, Decreased endurance, Decreased coordination  Visit Diagnosis: Other lack of coordination  Lower abdominal pain  Abnormal posture     Problem List There are no problems to display for this patient.   Sheria Lang PT, DPT [phone]  02/11/2021, 11:44 AM  [facility] [address] [place], [place], [address] Phone:  [phone]   Fax:  [phone]  Name: Loretto Belinsky MRN: [id number] Date of Birth: [birth year]

## 2021-02-18 ENCOUNTER — Other Ambulatory Visit: Payer: Self-pay

## 2021-02-18 ENCOUNTER — Ambulatory Visit: Payer: Medicare Other | Attending: Gastroenterology | Admitting: Physical Therapy

## 2021-02-18 ENCOUNTER — Encounter: Payer: Self-pay | Admitting: Physical Therapy

## 2021-02-18 DIAGNOSIS — R1030 Lower abdominal pain, unspecified: Secondary | ICD-10-CM | POA: Insufficient documentation

## 2021-02-18 DIAGNOSIS — R278 Other lack of coordination: Secondary | ICD-10-CM | POA: Insufficient documentation

## 2021-02-18 DIAGNOSIS — R293 Abnormal posture: Secondary | ICD-10-CM | POA: Insufficient documentation

## 2021-02-18 NOTE — Therapy (Signed)
[facility] [address] [place], [place], [address] Phone: [phone]   Fax:  [phone]  Physical Therapy Treatment  Patient Details  Name: Andrea Blake MRN: [id number] Date of Birth: [birth year] Referring Provider (PT): Genene Churn, MD   Encounter Date: 02/18/2021   PT End of Session - 02/18/21 0957     Visit Number 6    Number of Visits 12    Date for PT Re-Evaluation 04/01/21    Authorization Type 01/07/2021 IE    PT Start Time 0945    PT Stop Time 1025    PT Time Calculation (min) 40 min    Activity Tolerance Patient tolerated treatment well    Behavior During Therapy [facility] for tasks assessed/performed             Past Medical History:  Diagnosis Date   Arthritis    Diverticulitis    Hypertension     Past Surgical History:  Procedure Laterality Date   CATARACT EXTRACTION Bilateral    CHOLECYSTECTOMY     COLON SURGERY     EXCISION MORTON'S NEUROMA Left    HERNIA REPAIR     JOINT REPLACEMENT     ROTATOR CUFF REPAIR Right     There were no vitals filed for this visit.   Subjective Assessment - 02/18/21 0947     Subjective Patient states that she has been having good BMs. Patient notes that she has been leaking on the way to the toilet more with urination. She notes with strong urge she has difficulty coordinating PFM contraction to suppress; this works about 50% of the time. Patient notes that leakage in these scenarios is minimal.    Currently in Pain? No/denies            TREATMENT  Neuromuscular Re-education: Standing postural stability activities:  Resisted walk out (anti-rotation, B; anti-extension) (yellow theraband)  Counter TrA with march  Counter TrA with hip abduction   Counter TrA with hip extension  Patient educated throughout session on appropriate technique and form using multi-modal cueing, HEP, and activity modification. Patient articulated understanding and returned  demonstration.  Patient Response to interventions: Comfortable to trial exercises for ~ 2 weeks prior to f/u  ASSESSMENT Patient presents to clinic with excellent motivation to participate in therapy. Patient demonstrates deficits in PFM coordination, PFM strength, posture, and gait. Patient able to maintain erect posture with standing interventions albeit with mildly increased back pain during today's session and responded positively to active interventions. Patient will benefit from continued skilled therapeutic intervention to address remaining deficits in PFM coordination, PFM strength, posture, and gait in order to increase function, and improve overall QOL.    PT Long Term Goals - 01/07/21 1708       PT LONG TERM GOAL #1   Title Patient will demonstrate independence with HEP in order to maximize therapeutic gains and improve carryover from physical therapy sessions to ADLs in the home and community.    Baseline IE: not initiated    Time 12    Period Weeks    Status New    Target Date 04/01/21      PT LONG TERM GOAL #2   Title Patient will demonstrate improved function as evidenced by a score of >51 on FOTO Urinary measure for full participation in activities at home and in the community.    Baseline IE: 43    Time 12    Period Weeks    Status  New    Target Date 04/01/21      PT LONG TERM GOAL #3   Title Patient will demonstrate improved function as evidenced by a score of >56 on FOTO Constipation measure for full participation in activities at home and in the community.    Baseline IE: 49    Time 12    Period Weeks    Status New    Target Date 04/01/21      PT LONG TERM GOAL #4   Title Patient will demonstrate sustained eccentric contraction of PFM for > 15 seconds duration with continuous breathing strategy in order to improve bowel emptying and decrease risk of dangerous IAP (risk of spinal fracture and pelvic organ prolapse).    Baseline IE: not demonstrated    Time 12     Period Weeks    Status New    Target Date 04/01/21      PT LONG TERM GOAL #5   Title Patient will report urge incontinence occurrence of "once or less a week" or "more than once a week" for improved participation at home and in the community.    Baseline IE: "several times a day"    Time 12    Period Weeks    Status New    Target Date 04/01/21                   Plan - 02/18/21 0957     Clinical Impression Statement Patient presents to clinic with excellent motivation to participate in therapy. Patient demonstrates deficits in PFM coordination, PFM strength, posture, and gait. Patient able to maintain erect posture with standing interventions albeit with mildly increased back pain during today's session and responded positively to active interventions. Patient will benefit from continued skilled therapeutic intervention to address remaining deficits in PFM coordination, PFM strength, posture, and gait in order to increase function, and improve overall QOL.    Personal Factors and Comorbidities Comorbidity 3+;Age;Behavior Pattern;Past/Current Experience;Time since onset of injury/illness/exacerbation    Comorbidities lichen sclerosus, insomnia, dyspnea, GERD, aquired hypothyroidism, chronic back pain, HTN    Examination-Activity Limitations Continence;Toileting    Examination-Participation Restrictions Community Activity;Meal Prep;Laundry    Stability/Clinical Decision Making Evolving/Moderate complexity    Rehab Potential Fair    PT Frequency 1x / week    PT Duration 12 weeks    PT Treatment/Interventions ADLs/Self Care Home Management;Biofeedback;Cryotherapy;Electrical Stimulation;Moist Heat;Therapeutic exercise;Neuromuscular re-education;Patient/family education;Manual techniques;Taping;Therapeutic activities;Orthotic Fit/Training    PT Next Visit Plan R hip manual, deep core training    PT Home Exercise Plan 3x quick flicks, thoracic extension seated    Consulted and Agree  with Plan of Care Patient             Patient will benefit from skilled therapeutic intervention in order to improve the following deficits and impairments:  Abnormal gait, Postural dysfunction, Decreased strength, Improper body mechanics, Pain, Decreased endurance, Decreased coordination  Visit Diagnosis: Other lack of coordination  Lower abdominal pain  Abnormal posture     Problem List There are no problems to display for this patient.   Sheria Lang PT, DPT [phone]  02/18/2021, 11:48 AM  [facility] [address] [place], [place], [address] Phone: [phone]   Fax:  [phone]  Name: Andrea Blake MRN: [id number] Date of Birth: [birth year]

## 2021-03-03 ENCOUNTER — Ambulatory Visit
Admission: EM | Admit: 2021-03-03 | Discharge: 2021-03-03 | Disposition: A | Discharge status: Home / Self Care | Payer: Medicare Other

## 2021-03-03 ENCOUNTER — Other Ambulatory Visit: Payer: Self-pay

## 2021-03-03 DIAGNOSIS — M79644 Pain in right finger(s): Secondary | ICD-10-CM

## 2021-03-03 DIAGNOSIS — L089 Local infection of the skin and subcutaneous tissue, unspecified: Secondary | ICD-10-CM | POA: Diagnosis not present

## 2021-03-03 MED ORDER — DOXYCYCLINE HYCLATE 100 MG PO CAPS: 100.0000 mg | ORAL_CAPSULE | Freq: Two times a day (BID) | ORAL | 0 refills | Status: AC

## 2021-03-03 NOTE — Discharge Instructions (Addendum)
Recommend start Doxycycline 100mg  twice a day for 10 days- take with food. Continue to soak finger in warm water 3 to 4 times a day. Recommend call your Finger/Hand Specialist tomorrow to schedule appointment for further evaluation of right middle finger joint pain and swelling.

## 2021-03-03 NOTE — ED Provider Notes (Signed)
[facility]    CSN: [id number] Arrival date & time: 03/03/21  1445      History   Chief Complaint Chief Complaint  Patient presents with   Finger pain (Right hand)    HPI Andrea Blake is a 87 y.o. female.   14, almost 80, year old female presents with right middle third finger pain for the past week.  Has noticed her middle knuckle is more swollen and deformed.  Unable to bend that finger completely into a fist.  Has history of arthritis and nodules on other fingers in both hands as well as right 5th finger trigger finger. Was given a steroid injection in her right hand on 01/06/21 at her Orthopedic which resolved 5th finger symptoms for now. However, has developed a raised bump/irritation at the base of her cuticle on her right middle finger. Has been present for a number of months but in the past week has become more raised and painful with surrounding redness. No discharge or bleeding. Has been soaking her finger in warm salt water with some success. Concerned over early infection. Has reaction/allergies to multiple antibiotics and medications. Other chronic health issues include HTN, Arthritis, thyroid disorder, chronic back pain, GERD and Diverticulitis. Currently on Losartan, Norvasc, HCTZ, Synthroid, Pepcid, Tylenol, Cymbalta and multiple supplements daily.   The history is provided by the patient.   Past Medical History:  Diagnosis Date   Arthritis    Diverticulitis    Hypertension     There are no problems to display for this patient.   Past Surgical History:  Procedure Laterality Date   CATARACT EXTRACTION Bilateral    CHOLECYSTECTOMY     COLON SURGERY     EXCISION MORTON'S NEUROMA Left    HERNIA REPAIR     JOINT REPLACEMENT     ROTATOR CUFF REPAIR Right     OB History   No obstetric history on file.      Home Medications    Prior to Admission medications   Medication Sig Start Date End Date Taking? Authorizing Provider   acetaminophen (TYLENOL) 500 MG tablet Take 500 mg by mouth 2 (two) times daily.   Yes [provider]  amLODipine (NORVASC) 5 MG tablet Take 5 mg by mouth daily.   Yes [provider]  bisacodyl (DULCOLAX) 5 MG EC tablet Take 5 mg by mouth daily as needed for moderate constipation.   Yes [provider]  Calcium Carbonate-Vitamin D (OYSTER SHELL CALCIUM/D) 500-5 MG-MCG TABS Take by mouth.   Yes [provider]  doxycycline (VIBRAMYCIN) 100 MG capsule Take 1 capsule (100 mg total) by mouth 2 (two) times daily for 10 days. 03/03/21 03/13/21 Yes Tanganika Barradas, Ali Lowe, NP  DULoxetine (CYMBALTA) 20 MG capsule Take 20 mg by mouth daily.   Yes [provider]  famotidine (PEPCID) 20 MG tablet Take by mouth.   Yes [provider]  hydrochlorothiazide (HYDRODIURIL) 25 MG tablet Take 25 mg by mouth daily.   Yes [provider]  levothyroxine (SYNTHROID) 50 MCG tablet Take 50 mcg by mouth daily before breakfast.   Yes [provider]  losartan (COZAAR) 100 MG tablet Take 100 mg by mouth daily.   Yes [provider]  magnesium oxide (MAG-OX) 400 MG tablet Take 400 mg by mouth daily.   Yes [provider]  MELATONIN PO Take by mouth.   Yes [provider]  omega-3 fish oil (MAXEPA) 1000 MG CAPS capsule Take by mouth. 08/05/10  Yes [provider]  Wheat Dextrin (BENEFIBER PO) Take by mouth.   Yes [provider]    Family History Family History  Problem Relation Age of Onset   Breast cancer Neg Hx     Social History Social History   Tobacco Use   Smoking status: Never   Smokeless tobacco: Never  Vaping Use   Vaping Use: Never used  Substance Use Topics   Alcohol use: No   Drug use: No     Allergies   Other, Shellfish allergy, Sulfa antibiotics, Sulfasalazine, Amitriptyline, Flagyl [metronidazole], Gabapentin, Penicillins, Ciprofloxacin, and Levofloxacin   Review of  Systems Review of Systems  Constitutional:  Negative for appetite change, chills, fatigue and fever.  Gastrointestinal:  Negative for nausea and vomiting.  Musculoskeletal:  Positive for arthralgias, back pain, joint swelling and myalgias. Negative for neck pain and neck stiffness.  Skin:  Positive for color change and wound. Negative for rash.  Allergic/Immunologic: Positive for environmental allergies and food allergies.  Neurological:  Negative for dizziness, tremors, seizures, syncope, speech difficulty, weakness, numbness and headaches.  Hematological:  Negative for adenopathy. Does not bruise/bleed easily.    Physical Exam Triage Vital Signs ED Triage Vitals  Enc Vitals Group     BP 03/03/21 1624 (!) 146/69     Pulse Rate 03/03/21 1624 65     Resp 03/03/21 1624 18     Temp 03/03/21 1624 98.1 F (36.7 C)     Temp Source 03/03/21 1624 Oral     SpO2 03/03/21 1624 97 %     Weight 03/03/21 1624 179 lb 14.3 oz (81.6 kg)     Height --      Head Circumference --      Peak Flow --      Pain Score 03/03/21 1623 2     Pain Loc --      Pain Edu? --      Excl. in GC? --    No data found.  Updated Vital Signs BP (!) 146/69 (BP Location: Left Arm)    Pulse 65    Temp 98.1 F (36.7 C) (Oral)    Resp 18    Wt 179 lb 14.3 oz (81.6 kg)    SpO2 97%    BMI 33.99 kg/m   Visual Acuity Right Eye Distance:   Left Eye Distance:   Bilateral Distance:    Right Eye Near:   Left Eye Near:    Bilateral Near:     Physical Exam Vitals and nursing note reviewed.  Constitutional:      General: She is awake. She is not in acute distress.    Appearance: She is well-developed and well-groomed.     Comments: She is sitting comfortably in the exam chair in no acute distress.   HENT:     Head: Normocephalic and atraumatic.     Right Ear: Hearing normal.     Left Ear: Hearing normal.  Eyes:     Extraocular Movements: Extraocular movements intact.     Conjunctiva/sclera: Conjunctivae normal.   Cardiovascular:     Rate and Rhythm: Normal rate.  Pulmonary:     Effort: Pulmonary effort is normal.  Musculoskeletal:        General: Swelling and tenderness present.     Right hand: Swelling, deformity and tenderness present. Decreased range of motion. Normal strength. Normal sensation. There is no disruption of two-point discrimination. Normal capillary refill. Normal pulse.     Left hand: Deformity present. No  swelling or tenderness. Normal range of motion. Normal strength. Normal sensation. There is no disruption of two-point discrimination. Normal capillary refill. Normal pulse.       Hands:     Cervical back: Normal range of motion.     Comments: Able to fully extend both right and left hand without difficulty. Unable to completely make a fist with her right hand. 3rd middle finger will not flex completely at proximal and middle joint. Slight pain with flexion but minimal tenderness. Small round raised  lesion with slightly punctated center on right DIP near base of cuticle. Redness surrounding lesion and tender. No discharge or bleeding. Good distal pulses and capillary refill. No neuro deficits noted.   Skin:    General: Skin is warm and dry.     Capillary Refill: Capillary refill takes less than 2 seconds.     Findings: Erythema and lesion present. No abrasion, bruising, ecchymosis, laceration, petechiae or rash.  Neurological:     General: No focal deficit present.     Mental Status: She is alert and oriented to person, place, and time.     Sensory: Sensation is intact. No sensory deficit.     Motor: Motor function is intact.  Psychiatric:        Mood and Affect: Mood normal.        Behavior: Behavior normal. Behavior is cooperative.        Thought Content: Thought content normal.        Judgment: Judgment normal.     UC Treatments / Results  Labs (all labs ordered are listed, but only abnormal results are displayed) Labs Reviewed - No data to  display  EKG   Radiology No results found.  Procedures Procedures (including critical care time)  Medications Ordered in UC Medications - No data to display  Initial Impression / Assessment and Plan / UC Course  I have reviewed the triage vital signs and the nursing notes.  Pertinent labs & imaging results that were available during my care of the patient were reviewed by me and considered in my medical decision making (see chart for details).     Reviewed with patient that she appears to have a mild Paronychia/skin infection at her cuticle. May be related to lesion but uncertain of exact etiology of her lesion on her finger.  Discussed further evaluation by either hand surgeon or dermatologist.  For mild skin infection will start Doxycycline 100 mg twice a day for 10 days-take with food.  Continue to soak your right middle finger in warm water 3-4 times a day.  Discussed that limited flexion and increased swelling of her middle knuckle in her right hand may be due to arthritis or a ligament/tendon issue. Since patient has been here 3 hours and is stable, will defer imaging to Specialist. Needs further evaluation by a hand specialist.  Recommend call her hand surgeon tomorrow to schedule an appointment for further evaluation. Final Clinical Impressions(s) / UC Diagnoses   Final diagnoses:  Pain of right middle finger  Infection of skin of finger     Discharge Instructions      Recommend start Doxycycline 100mg  twice a day for 10 days- take with food. Continue to soak finger in warm water 3 to 4 times a day. Recommend call your Finger/Hand Specialist tomorrow to schedule appointment for further evaluation of right middle finger joint pain and swelling.     ED Prescriptions     Medication Sig Dispense Auth. Provider  doxycycline (VIBRAMYCIN) 100 MG capsule Take 1 capsule (100 mg total) by mouth 2 (two) times daily for 10 days. 20 capsule Melodee Lupe, Ali Lowe, NP      PDMP not  reviewed this encounter.   Sudie Grumbling, NP 03/04/21 1212

## 2021-03-03 NOTE — ED Triage Notes (Signed)
Patient is here for "Injury ?, Finger". Right hand "3rd digit, middle finger". History of nodules/arthritis. Redness, recurrent sore on finger "tip".

## 2021-03-04 ENCOUNTER — Encounter: Payer: Self-pay | Admitting: Physical Therapy

## 2021-03-04 ENCOUNTER — Ambulatory Visit: Payer: Medicare Other | Admitting: Physical Therapy

## 2021-03-04 DIAGNOSIS — R1030 Lower abdominal pain, unspecified: Secondary | ICD-10-CM

## 2021-03-04 DIAGNOSIS — R278 Other lack of coordination: Secondary | ICD-10-CM | POA: Diagnosis not present

## 2021-03-04 DIAGNOSIS — R293 Abnormal posture: Secondary | ICD-10-CM

## 2021-03-04 NOTE — Therapy (Signed)
[facility] [address] [place], [place], [address] Phone: [phone]   Fax:  [phone]  Physical Therapy Treatment  Patient Details  Name: Andrea Blake MRN: [id number] Date of Birth: 1931-09-26 Referring Provider (PT): Genene Churn, MD   Encounter Date: 03/04/2021   PT End of Session - 03/04/21 1005     Visit Number 7    Number of Visits 12    Date for PT Re-Evaluation 04/01/21    Authorization Type 01/07/2021 IE    PT Start Time 0945    PT Stop Time 1025    PT Time Calculation (min) 40 min    Activity Tolerance Patient tolerated treatment well    Behavior During Therapy [facility] for tasks assessed/performed             Past Medical History:  Diagnosis Date   Arthritis    Diverticulitis    Hypertension     Past Surgical History:  Procedure Laterality Date   CATARACT EXTRACTION Bilateral    CHOLECYSTECTOMY     COLON SURGERY     EXCISION MORTON'S NEUROMA Left    HERNIA REPAIR     JOINT REPLACEMENT     ROTATOR CUFF REPAIR Right     There were no vitals filed for this visit.   Subjective Assessment - 03/04/21 0946     Subjective Patient notes her third finger continues to be bothersome. Patient is soaking and will pick up prescription after appointment today. Patient reports that her pelvic symptoms have been pretty good. Patient notes that she has been managing to maintain regularity with BMs. Notes one episode of severe coughing that caused urinary leakage. Patient also delayed urination beyond her capacity when on a road trip. Patient notes that she continues to be comfortable without a pad in the community.    Currently in Pain? No/denies             TREATMENT  Neuromuscular Re-education: Reassessed FOTO; Urinary improved to 57, Constipation improved to 60. Discussed with patient next steps with respect to PT interventions. Reviewed AM/PM bed routines for improved lumbar mobility and  decreased LBP: including, hooklying trunk rotation, pelvic tilts, knee to chest, double knee to chest.  Patient educated throughout session on appropriate technique and form using multi-modal cueing, HEP, and activity modification. Patient articulated understanding and returned demonstration.  Patient Response to interventions: Comfortable to trial exercises for ~ 2 weeks prior to f/u  ASSESSMENT Patient presents to clinic with excellent motivation to participate in therapy. Patient demonstrates deficits in PFM coordination, PFM strength, posture, and gait. Patient indicating continued improvement in urinary and bowel symptoms both subjectively and via FOTO outcome measures during today's session and responded positively to educational interventions regarding lumbar mobility and pain. Patient will benefit from continued skilled therapeutic intervention to address remaining deficits in PFM coordination, PFM strength, posture, and gait in order to increase function, and improve overall QOL.     PT Long Term Goals - 01/07/21 1708       PT LONG TERM GOAL #1   Title Patient will demonstrate independence with HEP in order to maximize therapeutic gains and improve carryover from physical therapy sessions to ADLs in the home and community.    Baseline IE: not initiated    Time 12    Period Weeks    Status New    Target Date 04/01/21      PT LONG TERM GOAL #2   Title Patient will  demonstrate improved function as evidenced by a score of >51 on FOTO Urinary measure for full participation in activities at home and in the community.    Baseline IE: 43    Time 12    Period Weeks    Status New    Target Date 04/01/21      PT LONG TERM GOAL #3   Title Patient will demonstrate improved function as evidenced by a score of >56 on FOTO Constipation measure for full participation in activities at home and in the community.    Baseline IE: 49    Time 12    Period Weeks    Status New    Target Date  04/01/21      PT LONG TERM GOAL #4   Title Patient will demonstrate sustained eccentric contraction of PFM for > 15 seconds duration with continuous breathing strategy in order to improve bowel emptying and decrease risk of dangerous IAP (risk of spinal fracture and pelvic organ prolapse).    Baseline IE: not demonstrated    Time 12    Period Weeks    Status New    Target Date 04/01/21      PT LONG TERM GOAL #5   Title Patient will report urge incontinence occurrence of "once or less a week" or "more than once a week" for improved participation at home and in the community.    Baseline IE: "several times a day"    Time 12    Period Weeks    Status New    Target Date 04/01/21                   Plan - 03/04/21 1005     Clinical Impression Statement Patient presents to clinic with excellent motivation to participate in therapy. Patient demonstrates deficits in PFM coordination, PFM strength, posture, and gait. Patient indicating continued improvement in urinary and bowel symptoms both subjectively and via FOTO outcome measures during today's session and responded positively to educational interventions regarding lumbar mobility and pain. Patient will benefit from continued skilled therapeutic intervention to address remaining deficits in PFM coordination, PFM strength, posture, and gait in order to increase function, and improve overall QOL.    Personal Factors and Comorbidities Comorbidity 3+;Age;Behavior Pattern;Past/Current Experience;Time since onset of injury/illness/exacerbation    Comorbidities lichen sclerosus, insomnia, dyspnea, GERD, aquired hypothyroidism, chronic back pain, HTN    Examination-Activity Limitations Continence;Toileting    Examination-Participation Restrictions Community Activity;Meal Prep;Laundry    Stability/Clinical Decision Making Evolving/Moderate complexity    Rehab Potential Fair    PT Frequency 1x / week    PT Duration 12 weeks    PT  Treatment/Interventions ADLs/Self Care Home Management;Biofeedback;Cryotherapy;Electrical Stimulation;Moist Heat;Therapeutic exercise;Neuromuscular re-education;Patient/family education;Manual techniques;Taping;Therapeutic activities;Orthotic Fit/Training    PT Next Visit Plan R hip manual, deep core training    PT Home Exercise Plan 3x quick flicks, thoracic extension seated    Consulted and Agree with Plan of Care Patient             Patient will benefit from skilled therapeutic intervention in order to improve the following deficits and impairments:  Abnormal gait, Postural dysfunction, Decreased strength, Improper body mechanics, Pain, Decreased endurance, Decreased coordination  Visit Diagnosis: Other lack of coordination  Lower abdominal pain  Abnormal posture     Problem List There are no problems to display for this patient.   Sheria Lang PT, DPT [phone] 03/04/2021, 11:03 AM  [facility] [address] [place], [place], [address] Phone: [phone]   Fax:  [phone]  Name: Andrea Blake MRN: [id number] Date of Birth: [birth year]

## 2021-03-18 ENCOUNTER — Other Ambulatory Visit: Payer: Self-pay

## 2021-03-18 ENCOUNTER — Encounter: Payer: Self-pay | Admitting: Physical Therapy

## 2021-03-18 ENCOUNTER — Ambulatory Visit: Payer: Medicare Other | Attending: Gastroenterology | Admitting: Physical Therapy

## 2021-03-18 DIAGNOSIS — R1030 Lower abdominal pain, unspecified: Secondary | ICD-10-CM | POA: Insufficient documentation

## 2021-03-18 DIAGNOSIS — R293 Abnormal posture: Secondary | ICD-10-CM | POA: Diagnosis present

## 2021-03-18 DIAGNOSIS — R278 Other lack of coordination: Secondary | ICD-10-CM | POA: Diagnosis not present

## 2021-03-18 NOTE — Therapy (Signed)
[facility] [address]. [place], [place], [address] Phone: [phone]   Fax:  [phone]  Physical Therapy Treatment  Patient Details  Name: Andrea Blake MRN: [id number] Date of Birth: 05-16-1931 Referring Provider (PT): Genene Churn, MD   Encounter Date: 03/18/2021   PT End of Session - 03/18/21 0959     Visit Number 8    Number of Visits 12    Date for PT Re-Evaluation 04/01/21    Authorization Type 01/07/2021 IE    PT Start Time 0950    PT Stop Time 1030    PT Time Calculation (min) 40 min    Activity Tolerance Patient tolerated treatment well    Behavior During Therapy [facility] for tasks assessed/performed             Past Medical History:  Diagnosis Date   Arthritis    Diverticulitis    Hypertension     Past Surgical History:  Procedure Laterality Date   CATARACT EXTRACTION Bilateral    CHOLECYSTECTOMY     COLON SURGERY     EXCISION MORTON'S NEUROMA Left    HERNIA REPAIR     JOINT REPLACEMENT     ROTATOR CUFF REPAIR Right     There were no vitals filed for this visit.   Subjective Assessment - 03/18/21 0949     Subjective Patient states third finger is improving. Patient notes that with antibiotic treatment she had increased GI distress because she forgot to start a probiotic supplement/yogurt with medication. Patient notes symptoms have calmed down but she continues to have more frequent BMs that are soft but formed. Yesterday 5x BM over the course of the day; today 2 BMs so far. Patient had one bout of constipation which she treated OTC. Patient denies any difficulty with bowel control with recent Gi distress. Patient reports she is doing well with voiding; notes it's been several nights since having to use a pad.    Currently in Pain? No/denies             TREATMENT  Neuromuscular Re-education: Standing postural stability activities:  Resisted walk out (anti-rotation, B;  anti-extension) (yellow theraband)  Counter TrA with march  Counter TrA with hip abduction   Counter TrA with hip extension  Counter TrA with UE press down   Patient educated throughout session on appropriate technique and form using multi-modal cueing, HEP, and activity modification. Patient articulated understanding and returned demonstration.  Patient Response to interventions: Increased back discomfort at end which was resolved with MHP (unbilled)  ASSESSMENT Patient presents to clinic with excellent motivation to participate in therapy. Patient demonstrates deficits in PFM coordination, PFM strength, posture, and gait. Patient able to perform all standing postural interventions with good form during today's session and has maintained improved bowel and bladder control in the presence of adverse reaction to antibiotics. Patient will benefit from continued skilled therapeutic intervention to address remaining deficits in PFM coordination, PFM strength, posture, and gait in order to increase function, and improve overall QOL.     PT Long Term Goals - 01/07/21 1708       PT LONG TERM GOAL #1   Title Patient will demonstrate independence with HEP in order to maximize therapeutic gains and improve carryover from physical therapy sessions to ADLs in the home and community.    Baseline IE: not initiated    Time 12    Period Weeks    Status New    Target  Date 04/01/21      PT LONG TERM GOAL #2   Title Patient will demonstrate improved function as evidenced by a score of >51 on FOTO Urinary measure for full participation in activities at home and in the community.    Baseline IE: 43    Time 12    Period Weeks    Status New    Target Date 04/01/21      PT LONG TERM GOAL #3   Title Patient will demonstrate improved function as evidenced by a score of >56 on FOTO Constipation measure for full participation in activities at home and in the community.    Baseline IE: 49    Time 12     Period Weeks    Status New    Target Date 04/01/21      PT LONG TERM GOAL #4   Title Patient will demonstrate sustained eccentric contraction of PFM for > 15 seconds duration with continuous breathing strategy in order to improve bowel emptying and decrease risk of dangerous IAP (risk of spinal fracture and pelvic organ prolapse).    Baseline IE: not demonstrated    Time 12    Period Weeks    Status New    Target Date 04/01/21      PT LONG TERM GOAL #5   Title Patient will report urge incontinence occurrence of "once or less a week" or "more than once a week" for improved participation at home and in the community.    Baseline IE: "several times a day"    Time 12    Period Weeks    Status New    Target Date 04/01/21                   Plan - 03/18/21 1005     Clinical Impression Statement Patient presents to clinic with excellent motivation to participate in therapy. Patient demonstrates deficits in PFM coordination, PFM strength, posture, and gait. Patient able to perform all standing postural interventions with good form during today's session and has maintained improved bowel and bladder control in the presence of adverse reaction to antibiotics. Patient will benefit from continued skilled therapeutic intervention to address remaining deficits in PFM coordination, PFM strength, posture, and gait in order to increase function, and improve overall QOL.    Personal Factors and Comorbidities Comorbidity 3+;Age;Behavior Pattern;Past/Current Experience;Time since onset of injury/illness/exacerbation    Comorbidities lichen sclerosus, insomnia, dyspnea, GERD, aquired hypothyroidism, chronic back pain, HTN    Examination-Activity Limitations Continence;Toileting    Examination-Participation Restrictions Community Activity;Meal Prep;Laundry    Stability/Clinical Decision Making Evolving/Moderate complexity    Rehab Potential Fair    PT Frequency 1x / week    PT Duration 12 weeks     PT Treatment/Interventions ADLs/Self Care Home Management;Biofeedback;Cryotherapy;Electrical Stimulation;Moist Heat;Therapeutic exercise;Neuromuscular re-education;Patient/family education;Manual techniques;Taping;Therapeutic activities;Orthotic Fit/Training    PT Next Visit Plan R hip manual, deep core training    PT Home Exercise Plan 3x quick flicks, thoracic extension seated    Consulted and Agree with Plan of Care Patient             Patient will benefit from skilled therapeutic intervention in order to improve the following deficits and impairments:  Abnormal gait, Postural dysfunction, Decreased strength, Improper body mechanics, Pain, Decreased endurance, Decreased coordination  Visit Diagnosis: Other lack of coordination  Lower abdominal pain  Abnormal posture     Problem List There are no problems to display for this patient.   Sheria Lang PT,  DPT #[id number]  03/18/2021, 10:52 AM  [place] [facility] [address] [place], [place], [address] Phone: [phone]   Fax:  [phone]  Name: Andrea Blake MRN: [id number] Date of Birth: 05-12-1931

## 2021-04-01 ENCOUNTER — Other Ambulatory Visit: Payer: Self-pay

## 2021-04-01 ENCOUNTER — Ambulatory Visit: Payer: Medicare Other | Admitting: Physical Therapy

## 2021-04-01 ENCOUNTER — Encounter: Payer: Self-pay | Admitting: Physical Therapy

## 2021-04-01 DIAGNOSIS — R278 Other lack of coordination: Secondary | ICD-10-CM | POA: Diagnosis not present

## 2021-04-01 DIAGNOSIS — R293 Abnormal posture: Secondary | ICD-10-CM

## 2021-04-01 DIAGNOSIS — R1030 Lower abdominal pain, unspecified: Secondary | ICD-10-CM

## 2021-04-01 NOTE — Therapy (Signed)
[place] [facility] [address]. [place], [place], [address] Phone: [phone]   Fax:  [phone]  Physical Therapy Treatment  Patient Details  Name: Andrea Blake MRN: [id number] Date of Birth: 01-15-1932 Referring Provider (PT): Genene Churn, MD   Encounter Date: 04/01/2021   PT End of Session - 04/01/21 1506     Visit Number 9    Number of Visits 12    Date for PT Re-Evaluation 04/01/21    Authorization Type 01/07/2021 IE    PT Start Time 1500    PT Stop Time 1540    PT Time Calculation (min) 40 min    Activity Tolerance Patient tolerated treatment well    Behavior During Therapy [facility] for tasks assessed/performed             Past Medical History:  Diagnosis Date   Arthritis    Diverticulitis    Hypertension     Past Surgical History:  Procedure Laterality Date   CATARACT EXTRACTION Bilateral    CHOLECYSTECTOMY     COLON SURGERY     EXCISION MORTON'S NEUROMA Left    HERNIA REPAIR     JOINT REPLACEMENT     ROTATOR CUFF REPAIR Right     There were no vitals filed for this visit.   Subjective Assessment - 04/01/21 1502     Subjective Patient notes that she is still dealing with the bowel repercussions from the antibiotic prescribed for cyst on finger. Patient notes that she has been having a lot of abdominal cramping and pain with faster than normal transit times (<24 hours). Patient does note that she does well at night if she is faithful with exercises.    Currently in Pain? No/denies            TREATMENT  Neuromuscular Re-education: Reassessed goals; see below.    Patient educated throughout session on appropriate technique and form using multi-modal cueing, HEP, and activity modification. Patient articulated understanding and returned demonstration.  Patient Response to interventions: Comfortable to trial d/c  ASSESSMENT Patient presents to clinic with excellent motivation to participate  in therapy. Patient demonstrates deficits in PFM coordination, PFM strength, posture, and gait. Patient indicating goal achievement in all areas (see below) during today's session and has responded well to educational, postural, PFM strengthening, and other active interventions over the course of therapy. Patient may benefit from continued skilled therapeutic intervention to address any returning deficits in PFM coordination, PFM strength, posture, and gait but at this time is appropriate to trial discharge to self-management.    PT Long Term Goals - 04/01/21 1507       PT LONG TERM GOAL #1   Title Patient will demonstrate independence with HEP in order to maximize therapeutic gains and improve carryover from physical therapy sessions to ADLs in the home and community.    Baseline IE: not initiated; 1/18: IND    Time 12    Period Weeks    Status Achieved    Target Date 04/01/21      PT LONG TERM GOAL #2   Title Patient will demonstrate improved function as evidenced by a score of >51 on FOTO Urinary measure for full participation in activities at home and in the community.    Baseline IE: 43; 12/21: 57; 1/18: 52    Time 12    Period Weeks    Status Achieved    Target Date 04/01/21      PT LONG TERM  GOAL #3   Title Patient will demonstrate improved function as evidenced by a score of >56 on FOTO Constipation measure for full participation in activities at home and in the community.    Baseline IE: 49; 12/21: 60; 1/18: 60    Time 12    Period Weeks    Status Achieved    Target Date 04/01/21      PT LONG TERM GOAL #4   Title Patient will demonstrate sustained eccentric contraction of PFM for > 15 seconds duration with continuous breathing strategy in order to improve bowel emptying and decrease risk of dangerous IAP (risk of spinal fracture and pelvic organ prolapse).    Baseline IE: not demonstrated; 1/18: 16 seconds    Time 12    Period Weeks    Status Achieved    Target Date  04/01/21      PT LONG TERM GOAL #5   Title Patient will report urge incontinence occurrence of "once or less a week" or "more than once a week" for improved participation at home and in the community.    Baseline IE: "several times a day"; 1/18: "more than once a week"    Time 12    Period Weeks    Status Achieved    Target Date 04/01/21                   Plan - 04/01/21 1507     Clinical Impression Statement Patient presents to clinic with excellent motivation to participate in therapy. Patient demonstrates deficits in PFM coordination, PFM strength, posture, and gait. Patient indicating goal achievement in all areas (see below) during today's session and has responded well to educational, postural, PFM strengthening, and other active interventions over the course of therapy. Patient may benefit from continued skilled therapeutic intervention to address any returning deficits in PFM coordination, PFM strength, posture, and gait but at this time is appropriate to trial discharge to self-management.    Personal Factors and Comorbidities Comorbidity 3+;Age;Behavior Pattern;Past/Current Experience;Time since onset of injury/illness/exacerbation    Comorbidities lichen sclerosus, insomnia, dyspnea, GERD, aquired hypothyroidism, chronic back pain, HTN    Examination-Activity Limitations Continence;Toileting    Examination-Participation Restrictions Community Activity;Meal Prep;Laundry    Stability/Clinical Decision Making Evolving/Moderate complexity    Rehab Potential Fair    PT Frequency 1x / week    PT Duration 12 weeks    PT Treatment/Interventions ADLs/Self Care Home Management;Biofeedback;Cryotherapy;Electrical Stimulation;Moist Heat;Therapeutic exercise;Neuromuscular re-education;Patient/family education;Manual techniques;Taping;Therapeutic activities;Orthotic Fit/Training    PT Home Exercise Plan 3x quick flicks, thoracic extension seated    Consulted and Agree with Plan of Care  Patient             Patient will benefit from skilled therapeutic intervention in order to improve the following deficits and impairments:  Abnormal gait, Postural dysfunction, Decreased strength, Improper body mechanics, Pain, Decreased endurance, Decreased coordination  Visit Diagnosis: Other lack of coordination  Lower abdominal pain  Abnormal posture     Problem List There are no problems to display for this patient.   Sheria Lang PT, DPT [phone]  04/01/2021, 4:06 PM  [place] [facility] [address] [place], [place], [address] Phone: [phone]   Fax:  [phone]  Name: Andrea Blake MRN: [id number] Date of Birth: 16-Aug-1931

## 2021-04-15 ENCOUNTER — Encounter: Payer: Self-pay | Admitting: Physical Therapy

## 2021-08-17 ENCOUNTER — Ambulatory Visit
Admission: RE | Admit: 2021-08-17 | Discharge: 2021-08-17 | Disposition: A | Discharge status: Home / Self Care | Payer: Medicare Other | Source: Ambulatory Visit | Attending: Student | Admitting: Student

## 2021-08-17 ENCOUNTER — Other Ambulatory Visit: Payer: Self-pay | Admitting: Student

## 2021-08-17 DIAGNOSIS — Z1231 Encounter for screening mammogram for malignant neoplasm of breast: Secondary | ICD-10-CM | POA: Insufficient documentation

## 2022-10-12 ENCOUNTER — Ambulatory Visit
Payer: Medicare Other | Attending: Student in an Organized Health Care Education/Training Program | Admitting: Student in an Organized Health Care Education/Training Program

## 2022-10-12 ENCOUNTER — Encounter: Payer: Self-pay | Admitting: Student in an Organized Health Care Education/Training Program

## 2022-10-12 VITALS — BP 149/75 | HR 70 | Temp 97.3°F | Resp 16 | Ht 60.0 in | Wt 170.0 lb

## 2022-10-12 DIAGNOSIS — G894 Chronic pain syndrome: Secondary | ICD-10-CM | POA: Diagnosis present

## 2022-10-12 DIAGNOSIS — M5136 Other intervertebral disc degeneration, lumbar region: Secondary | ICD-10-CM | POA: Insufficient documentation

## 2022-10-12 DIAGNOSIS — M47816 Spondylosis without myelopathy or radiculopathy, lumbar region: Secondary | ICD-10-CM | POA: Diagnosis present

## 2022-10-12 DIAGNOSIS — M1711 Unilateral primary osteoarthritis, right knee: Secondary | ICD-10-CM | POA: Diagnosis present

## 2022-10-12 DIAGNOSIS — M48062 Spinal stenosis, lumbar region with neurogenic claudication: Secondary | ICD-10-CM | POA: Diagnosis present

## 2022-10-12 NOTE — Progress Notes (Signed)
Safety precautions to be maintained throughout the outpatient stay will include: orient to surroundings, keep bed in low position, maintain call bell within reach at all times, provide assistance with transfer out of bed and ambulation.  

## 2022-10-12 NOTE — Assessment & Plan Note (Signed)
Stable

## 2022-10-12 NOTE — Assessment & Plan Note (Signed)
Recommend L-Mri and consider MILD

## 2022-10-12 NOTE — Patient Instructions (Signed)
You have been referred for MRI   GENERAL RISKS AND COMPLICATIONS  What are the risk, side effects and possible complications? Generally speaking, most procedures are safe.  However, with any procedure there are risks, side effects, and the possibility of complications.  The risks and complications are dependent upon the sites that are lesioned, or the type of nerve block to be performed.  The closer the procedure is to the spine, the more serious the risks are.  Great care is taken when placing the radio frequency needles, block needles or lesioning probes, but sometimes complications can occur. Infection: Any time there is an injection through the skin, there is a risk of infection.  This is why sterile conditions are used for these blocks.  There are four possible types of infection. Localized skin infection. Central Nervous System Infection-This can be in the form of Meningitis, which can be deadly. Epidural Infections-This can be in the form of an epidural abscess, which can cause pressure inside of the spine, causing compression of the spinal cord with subsequent paralysis. This would require an emergency surgery to decompress, and there are no guarantees that the patient would recover from the paralysis. Discitis-This is an infection of the intervertebral discs.  It occurs in about 1% of discography procedures.  It is difficult to treat and it may lead to surgery.        2. Pain: the needles have to go through skin and soft tissues, will cause soreness.       3. Damage to internal structures:  The nerves to be lesioned may be near blood vessels or    other nerves which can be potentially damaged.       4. Bleeding: Bleeding is more common if the patient is taking blood thinners such as  aspirin, Coumadin, Ticiid, Plavix, etc., or if he/she have some genetic predisposition  such as hemophilia. Bleeding into the spinal canal can cause compression of the spinal  cord with subsequent paralysis.   This would require an emergency surgery to  decompress and there are no guarantees that the patient would recover from the  paralysis.       5. Pneumothorax:  Puncturing of a lung is a possibility, every time a needle is introduced in  the area of the chest or upper back.  Pneumothorax refers to free air around the  collapsed lung(s), inside of the thoracic cavity (chest cavity).  Another two possible  complications related to a similar event would include: Hemothorax and Chylothorax.   These are variations of the Pneumothorax, where instead of air around the collapsed  lung(s), you may have blood or chyle, respectively.       6. Spinal headaches: They may occur with any procedures in the area of the spine.       7. Persistent CSF (Cerebro-Spinal Fluid) leakage: This is a rare problem, but may occur  with prolonged intrathecal or epidural catheters either due to the formation of a fistulous  track or a dural tear.       8. Nerve damage: By working so close to the spinal cord, there is always a possibility of  nerve damage, which could be as serious as a permanent spinal cord injury with  paralysis.       9. Death:  Although rare, severe deadly allergic reactions known as "Anaphylactic  reaction" can occur to any of the medications used.      10. Worsening of the symptoms:  We can always  make thing worse.  What are the chances of something like this happening? Chances of any of this occuring are extremely low.  By statistics, you have more of a chance of getting killed in a motor vehicle accident: while driving to the hospital than any of the above occurring .  Nevertheless, you should be aware that they are possibilities.  In general, it is similar to taking a shower.  Everybody knows that you can slip, hit your head and get killed.  Does that mean that you should not shower again?  Nevertheless always keep in mind that statistics do not mean anything if you happen to be on the wrong side of them.  Even if  a procedure has a 1 (one) in a 1,000,000 (million) chance of going wrong, it you happen to be that one..Also, keep in mind that by statistics, you have more of a chance of having something go wrong when taking medications.  Who should not have this procedure? If you are on a blood thinning medication (e.g. Coumadin, Plavix, see list of "Blood Thinners"), or if you have an active infection going on, you should not have the procedure.  If you are taking any blood thinners, please inform your physician.  How should I prepare for this procedure? Do not eat or drink anything at least six hours prior to the procedure. Bring a driver with you .  It cannot be a taxi. Come accompanied by an adult that can drive you back, and that is strong enough to help you if your legs get weak or numb from the local anesthetic. Take all of your medicines the morning of the procedure with just enough water to swallow them. If you have diabetes, make sure that you are scheduled to have your procedure done first thing in the morning, whenever possible. If you have diabetes, take only half of your insulin dose and notify our nurse that you have done so as soon as you arrive at the clinic. If you are diabetic, but only take blood sugar pills (oral hypoglycemic), then do not take them on the morning of your procedure.  You may take them after you have had the procedure. Do not take aspirin or any aspirin-containing medications, at least eleven (11) days prior to the procedure.  They may prolong bleeding. Wear loose fitting clothing that may be easy to take off and that you would not mind if it got stained with Betadine or blood. Do not wear any jewelry or perfume Remove any nail coloring.  It will interfere with some of our monitoring equipment.  NOTE: Remember that this is not meant to be interpreted as a complete list of all possible complications.  Unforeseen problems may occur.  BLOOD THINNERS The following drugs  contain aspirin or other products, which can cause increased bleeding during surgery and should not be taken for 2 weeks prior to and 1 week after surgery.  If you should need take something for relief of minor pain, you may take acetaminophen which is found in Tylenol,m Datril, Anacin-3 and Panadol. It is not blood thinner. The products listed below are.  Do not take any of the products listed below in addition to any listed on your instruction sheet.  A.P.C or A.P.C with Codeine Codeine Phosphate Capsules #3 Ibuprofen Ridaura  ABC compound Congesprin Imuran rimadil  Advil Cope Indocin Robaxisal  Alka-Seltzer Effervescent Pain Reliever and Antacid Coricidin or Coricidin-D  Indomethacin Rufen  Alka-Seltzer plus Cold Medicine Cosprin Ketoprofen S-A-C  Tablets  Anacin Analgesic Tablets or Capsules Coumadin Korlgesic Salflex  Anacin Extra Strength Analgesic tablets or capsules CP-2 Tablets Lanoril Salicylate  Anaprox Cuprimine Capsules Levenox Salocol  Anexsia-D Dalteparin Magan Salsalate  Anodynos Darvon compound Magnesium Salicylate Sine-off  Ansaid Dasin Capsules Magsal Sodium Salicylate  Anturane Depen Capsules Marnal Soma  APF Arthritis pain formula Dewitt's Pills Measurin Stanback  Argesic Dia-Gesic Meclofenamic Sulfinpyrazone  Arthritis Bayer Timed Release Aspirin Diclofenac Meclomen Sulindac  Arthritis pain formula Anacin Dicumarol Medipren Supac  Analgesic (Safety coated) Arthralgen Diffunasal Mefanamic Suprofen  Arthritis Strength Bufferin Dihydrocodeine Mepro Compound Suprol  Arthropan liquid Dopirydamole Methcarbomol with Aspirin Synalgos  ASA tablets/Enseals Disalcid Micrainin Tagament  Ascriptin Doan's Midol Talwin  Ascriptin A/D Dolene Mobidin Tanderil  Ascriptin Extra Strength Dolobid Moblgesic Ticlid  Ascriptin with Codeine Doloprin or Doloprin with Codeine Momentum Tolectin  Asperbuf Duoprin Mono-gesic Trendar  Aspergum Duradyne Motrin or Motrin IB Triminicin  Aspirin  plain, buffered or enteric coated Durasal Myochrisine Trigesic  Aspirin Suppositories Easprin Nalfon Trillsate  Aspirin with Codeine Ecotrin Regular or Extra Strength Naprosyn Uracel  Atromid-S Efficin Naproxen Ursinus  Auranofin Capsules Elmiron Neocylate Vanquish  Axotal Emagrin Norgesic Verin  Azathioprine Empirin or Empirin with Codeine Normiflo Vitamin E  Azolid Emprazil Nuprin Voltaren  Bayer Aspirin plain, buffered or children's or timed BC Tablets or powders Encaprin Orgaran Warfarin Sodium  Buff-a-Comp Enoxaparin Orudis Zorpin  Buff-a-Comp with Codeine Equegesic Os-Cal-Gesic   Buffaprin Excedrin plain, buffered or Extra Strength Oxalid   Bufferin Arthritis Strength Feldene Oxphenbutazone   Bufferin plain or Extra Strength Feldene Capsules Oxycodone with Aspirin   Bufferin with Codeine Fenoprofen Fenoprofen Pabalate or Pabalate-SF   Buffets II Flogesic Panagesic   Buffinol plain or Extra Strength Florinal or Florinal with Codeine Panwarfarin   Buf-Tabs Flurbiprofen Penicillamine   Butalbital Compound Four-way cold tablets Penicillin   Butazolidin Fragmin Pepto-Bismol   Carbenicillin Geminisyn Percodan   Carna Arthritis Reliever Geopen Persantine   Carprofen Gold's salt Persistin   Chloramphenicol Goody's Phenylbutazone   Chloromycetin Haltrain Piroxlcam   Clmetidine heparin Plaquenil   Cllnoril Hyco-pap Ponstel   Clofibrate Hydroxy chloroquine Propoxyphen         Before stopping any of these medications, be sure to consult the physician who ordered them.  Some, such as Coumadin (Warfarin) are ordered to prevent or treat serious conditions such as "deep thrombosis", "pumonary embolisms", and other heart problems.  The amount of time that you may need off of the medication may also vary with the medication and the reason for which you were taking it.  If you are taking any of these medications, please make sure you notify your pain physician before you undergo any  procedures.         Facet Blocks Patient Information  Description: The facets are joints in the spine between the vertebrae.  Like any joints in the body, facets can become irritated and painful.  Arthritis can also effect the facets.  By injecting steroids and local anesthetic in and around these joints, we can temporarily block the nerve supply to them.  Steroids act directly on irritated nerves and tissues to reduce selling and inflammation which often leads to decreased pain.  Facet blocks may be done anywhere along the spine from the neck to the low back depending upon the location of your pain.   After numbing the skin with local anesthetic (like Novocaine), a small needle is passed onto the facet joints under x-ray guidance.  You may experience a sensation of  pressure while this is being done.  The entire block usually lasts about 15-25 minutes.   Conditions which may be treated by facet blocks:  Low back/buttock pain Neck/shoulder pain Certain types of headaches  Preparation for the injection:  Do not eat any solid food or dairy products within 8 hours of your appointment. You may drink clear liquid up to 3 hours before appointment.  Clear liquids include water, black coffee, juice or soda.  No milk or cream please. You may take your regular medication, including pain medications, with a sip of water before your appointment.  Diabetics should hold regular insulin (if taken separately) and take 1/2 normal NPH dose the morning of the procedure.  Carry some sugar containing items with you to your appointment. A driver must accompany you and be prepared to drive you home after your procedure. Bring all your current medications with you. An IV may be inserted and sedation may be given at the discretion of the physician. A blood pressure cuff, EKG and other monitors will often be applied during the procedure.  Some patients may need to have extra oxygen administered for a short  period. You will be asked to provide medical information, including your allergies and medications, prior to the procedure.  We must know immediately if you are taking blood thinners (like Coumadin/Warfarin) or if you are allergic to IV iodine contrast (dye).  We must know if you could possible be pregnant.  Possible side-effects:  Bleeding from needle site Infection (rare, may require surgery) Nerve injury (rare) Numbness & tingling (temporary) Difficulty urinating (rare, temporary) Spinal headache (a headache worse with upright posture) Light-headedness (temporary) Pain at injection site (serveral days) Decreased blood pressure (rare, temporary) Weakness in arm/leg (temporary) Pressure sensation in back/neck (temporary)   Call if you experience:  Fever/chills associated with headache or increased back/neck pain Headache worsened by an upright position New onset, weakness or numbness of an extremity below the injection site Hives or difficulty breathing (go to the emergency room) Inflammation or drainage at the injection site(s) Severe back/neck pain greater than usual New symptoms which are concerning to you  Please note:  Although the local anesthetic injected can often make your back or neck feel good for several hours after the injection, the pain will likely return. It takes 3-7 days for steroids to work.  You may not notice any pain relief for at least one week.  If effective, we will often do a series of 2-3 injections spaced 3-6 weeks apart to maximally decrease your pain.  After the initial series, you may be a candidate for a more permanent nerve block of the facets.  If you have any questions, please call #[phone] [facility]

## 2022-10-12 NOTE — Assessment & Plan Note (Signed)
Andrea Blake has a history of greater than 3 months of moderate to severe pain which is resulted in functional impairment.  The patient has tried various conservative therapeutic options such as NSAIDs, Tylenol, muscle relaxants, physical therapy which was inadequately effective.  Patient's pain is predominantly axial with physical exam and xray findings suggestive of facet arthropathy. Lumbar facet medial branch nerve blocks were discussed with the patient.  Risks and benefits were reviewed.  Patient would like to proceed with bilateral L3, L4, L5 medial branch nerve block.

## 2022-10-12 NOTE — Progress Notes (Signed)
Patient: Andrea Blake  Service Category: E/M  Provider: Edward Jolly, MD  DOB: [birth year]  DOS: 10/12/2022  Referring Provider: [facility]  MRN: [id number]  Setting: Ambulatory outpatient  PCP: [facility]  Type: New Patient  Specialty: Interventional Pain Management    Location: Office  Delivery: Face-to-face     Primary Reason(s) for Visit: Encounter for initial evaluation of one or more chronic problems (new to examiner) potentially causing chronic pain, and posing a threat to normal musculoskeletal function. (Level of risk: High) CC: Back Pain and Pain (Left lower buttock)  HPI  Andrea Blake is a 89 y.o. year old, female patient, who comes for the first time to our practice referred by [facility] for our initial evaluation of her chronic pain. She has Lumbar spondylosis; Spinal stenosis, lumbar region, with neurogenic claudication; Lumbar degenerative disc disease; and Chronic pain syndrome on their problem list. Today she comes in for evaluation of her Back Pain and Pain (Left lower buttock)  Pain Assessment: Location: Lower, Medial Back Radiating: denies Onset: More than a month ago Duration: Chronic pain Quality: Sharp, Stabbing, Nagging Severity: 7 /10 (subjective, self-reported pain score)  Effect on ADL: unable to stand for extended periods; can only sleep in recliner Timing: Constant Modifying factors: sitting BP: (!) 149/75  HR: 70  Onset and Duration: Gradual and Present longer than 3 months Cause of pain: Unknown Severity: No change since onset, NAS-11 at its worse: 8/10, and NAS-11 at its best: 0/10 Timing: Not influenced by the time of the day and During activity or exercise Aggravating Factors: Bending, Lifiting, Motion, Prolonged standing, Squatting, Stooping , Twisting, Walking, Walking uphill, Walking downhill, and Working Alleviating Factors: Lying down, Medications, Resting, Sitting, Sleeping, and Warm showers or  baths Associated Problems: Fatigue and Inability to control bladder (urine) Quality of Pain: Intermittent, Disabling, Distressing, Nagging, Sharp, and Stabbing Previous Examinations or Tests: X-rays and Orthopedic evaluation Previous Treatments: Physical Therapy, Strengthening exercises, Stretching exercises, and TENS  Andrea Blake is being evaluated for possible interventional pain management therapies for the treatment of her chronic pain.   Andrea Blake is a pleasant 89 year old female who presents with a chief complaint of low back pain.  She also endorses difficulty standing or walking for short period of time.  She states that she has to bend forward while performing ADLs.  She has done physical therapy earlier this year.  She states that this helped with her lower back strength but not with her low back pain.  She has seen physical medicine and rehab in the past.  Lumbar spine x-rays as below shows severe multilevel facet arthropathy.  I suspect that she also has spinal stenosis with neurogenic claudication given her limited walking distance.  She has a history of bilateral shoulder replacements and left knee replacement.  She is endorsing mild to moderate right knee pain as well related to right knee osteoarthritis.  She takes tramadol occasionally which she states does help with her pain.  Andrea Blake has been informed that this initial visit was an evaluation only.  On the follow up appointment I will go over the results, including ordered tests and available interventional therapies. At that time she will have the opportunity to decide whether to proceed with offered therapies or not. In the event that Andrea Blake prefers avoiding interventional options, this will conclude our involvement in the case.  Medication management recommendations may be provided upon request.  Historic Controlled Substance Pharmacotherapy Review   Historical Monitoring:  The patient  reports no history of drug  use. List of prior UDS Testing: No results found for: "MDMA", "COCAINSCRNUR", "PCPSCRNUR", "PCPQUANT", "CANNABQUANT", "THCU", "ETH", "CBDTHCR", "D8THCCBX", "D9THCCBX" Historical Background Evaluation: [place] PMP: PDMP reviewed during this encounter. Review of the past 47-months conducted.             [place] Department of public safety, offender search: Engineer, mining Information) Non-contributory Risk Assessment Profile: Aberrant behavior: None observed or detected today Risk factors for fatal opioid overdose: None identified today Fatal overdose hazard ratio (HR): Calculation deferred Non-fatal overdose hazard ratio (HR): Calculation deferred Risk of opioid abuse or dependence: 0.7-3.0% with doses ? 36 MME/day and 6.1-26% with doses ? 120 MME/day. Substance use disorder (SUD) risk level: See below Personal History of Substance Abuse (SUD-Substance use disorder):  Alcohol: Negative  Illegal Drugs: Negative  Rx Drugs: Negative  ORT Risk Level calculation: Low Risk  Opioid Risk Tool - 10/12/22 1014       Family History of Substance Abuse   Alcohol Positive Female    Illegal Drugs Negative    Rx Drugs Negative      Personal History of Substance Abuse   Alcohol Negative    Illegal Drugs Negative    Rx Drugs Negative      Age   Age between 16-45 years  No      History of Preadolescent Sexual Abuse   History of Preadolescent Sexual Abuse Negative or Female      Psychological Disease   Psychological Disease Negative    Depression Negative      Total Score   Opioid Risk Tool Scoring 1    Opioid Risk Interpretation Low Risk            ORT Scoring interpretation table:  Score <3 = Low Risk for SUD  Score between 4-7 = Moderate Risk for SUD  Score >8 = High Risk for Opioid Abuse   PHQ-2 Depression Scale:  Total score: 0  PHQ-2 Scoring interpretation table: (Score and probability of major depressive disorder)  Score 0 = No depression  Score 1 = 15.4% Probability  Score 2 = 21.1%  Probability  Score 3 = 38.4% Probability  Score 4 = 45.5% Probability  Score 5 = 56.4% Probability  Score 6 = 78.6% Probability   PHQ-9 Depression Scale:  Total score: 0  PHQ-9 Scoring interpretation table:  Score 0-4 = No depression  Score 5-9 = Mild depression  Score 10-14 = Moderate depression  Score 15-19 = Moderately severe depression  Score 20-27 = Severe depression (2.4 times higher risk of SUD and 2.89 times higher risk of overuse)   Pharmacologic Plan: As per protocol, I have not taken over any controlled substance management, pending the results of ordered tests and/or consults.            Initial impression: Pending review of available data and ordered tests.  Meds   Current Outpatient Medications:    acetaminophen (TYLENOL) 500 MG tablet, Take 500 mg by mouth 2 (two) times daily., Disp: , Rfl:    amLODipine (NORVASC) 5 MG tablet, Take 5 mg by mouth daily., Disp: , Rfl:    bisacodyl (DULCOLAX) 5 MG EC tablet, Take 5 mg by mouth daily as needed for moderate constipation., Disp: , Rfl:    Calcium Carbonate-Vitamin D (OYSTER SHELL CALCIUM/D) 500-5 MG-MCG TABS, Take by mouth., Disp: , Rfl:    DULoxetine (CYMBALTA) 20 MG capsule, Take 20 mg by mouth daily., Disp: , Rfl:  hydrochlorothiazide (HYDRODIURIL) 25 MG tablet, Take 25 mg by mouth daily., Disp: , Rfl:    levothyroxine (SYNTHROID) 50 MCG tablet, Take 50 mcg by mouth daily before breakfast., Disp: , Rfl:    losartan (COZAAR) 100 MG tablet, Take 100 mg by mouth daily., Disp: , Rfl:    lubiprostone (AMITIZA) 8 MCG capsule, Take 8 mcg by mouth 2 (two) times daily with a meal., Disp: , Rfl:    magnesium oxide (MAG-OX) 400 MG tablet, Take 400 mg by mouth daily., Disp: , Rfl:    MELATONIN PO, Take by mouth., Disp: , Rfl:    omega-3 fish oil (MAXEPA) 1000 MG CAPS capsule, Take by mouth., Disp: , Rfl:    omeprazole (PRILOSEC) 20 MG capsule, Take 20 mg by mouth daily., Disp: , Rfl:    traMADol (ULTRAM) 50 MG tablet, Take 50 mg  by mouth 3 (three) times daily as needed., Disp: , Rfl:    Wheat Dextrin (BENEFIBER PO), Take by mouth., Disp: , Rfl:    famotidine (PEPCID) 20 MG tablet, Take by mouth. (Patient not taking: Reported on 10/12/2022), Disp: , Rfl:   Imaging Review   X-RAY LUMBOSACRAL SPINE (2-3 VIEWS), 07/03/2021 12:29 PM  INDICATION: Low back pain \ M54.50 Lumbar pain COMPARISON: None. Procedure Note  Doran Stabler, MD - 04/09/2022 Formatting of this note might be different from the original. X-RAY LUMBOSACRAL SPINE (2-3 VIEWS), 07/03/2021 12:29 PM  INDICATION: Low back pain \ M54.50 Lumbar pain COMPARISON: None.  IMPRESSION:  1.  No definite vertebral body height loss to suggest acute fracture. 2.  Levocurvature of the lumbar spine centered about L2. Question minimal retrolisthesis of L1 on L2. 3.  Marked disc height loss versus partial fusion at L2-L3. Severe degenerative disc disease at the remaining lumbar levels. 4.  Severe multilevel facet arthropathy. 5.  Mild bilateral SI joint and hip degenerative changes. 6.  Moderate degenerative changes of the pubic symphysis.  Complexity Note: Imaging results reviewed.                         ROS  Cardiovascular: High blood pressure, Chest pain, Heart murmur, and Needs antibiotics prior to dental procedures Pulmonary or Respiratory: Shortness of breath Neurological: Curved spine Psychological-Psychiatric: No reported psychological or psychiatric signs or symptoms such as difficulty sleeping, anxiety, depression, delusions or hallucinations (schizophrenial), mood swings (bipolar disorders) or suicidal ideations or attempts Gastrointestinal: Reflux or heatburn and Irregular, infrequent bowel movements (Constipation) Genitourinary: No reported renal or genitourinary signs or symptoms such as difficulty voiding or producing urine, peeing blood, non-functioning kidney, kidney stones, difficulty emptying the bladder, difficulty controlling the flow of urine, or  chronic kidney disease Hematological: Brusing easily Endocrine: Slow thyroid Rheumatologic: Joint aches and or swelling due to excess weight (Osteoarthritis) Musculoskeletal: Negative for myasthenia gravis, muscular dystrophy, multiple sclerosis or malignant hyperthermia Work History: Retired  Allergies  Ms. Prucha is allergic to other, shellfish allergy, sulfa antibiotics, sulfasalazine, amitriptyline, flagyl [metronidazole], gabapentin, penicillins, ciprofloxacin, and levofloxacin.  Laboratory Chemistry Profile   Renal Lab Results  Component Value Date   BUN 29 (H) 11/24/2016   CREATININE 0.97 11/24/2016   GFRAA >60 11/24/2016   GFRNONAA 53 (L) 11/24/2016     Electrolytes Lab Results  Component Value Date   NA 139 11/24/2016   K 3.2 (L) 11/24/2016   CL 104 11/24/2016   CALCIUM 8.7 (L) 11/24/2016     Hepatic No results found for: "AST", "ALT", "ALBUMIN", "ALKPHOS", "AMYLASE", "LIPASE", "AMMONIA"  ID No results found for: "LYMEIGGIGMAB", "HIV", "SARSCOV2NAA", "STAPHAUREUS", "MRSAPCR", "HCVAB", "PREGTESTUR", "RMSFIGG", "QFVRPH1IGG", "QFVRPH2IGG"   Bone No results found for: "VD25OH", "VD125OH2TOT", "ZO1096EA5", "WU9811BJ4", "25OHVITD1", "25OHVITD2", "25OHVITD3", "TESTOFREE", "TESTOSTERONE"   Endocrine Lab Results  Component Value Date   GLUCOSE 110 (H) 11/24/2016     Neuropathy No results found for: "VITAMINB12", "FOLATE", "HGBA1C", "HIV"   CNS No results found for: "COLORCSF", "APPEARCSF", "RBCCOUNTCSF", "WBCCSF", "POLYSCSF", "LYMPHSCSF", "EOSCSF", "PROTEINCSF", "GLUCCSF", "JCVIRUS", "CSFOLI", "IGGCSF", "LABACHR", "ACETBL"   Inflammation (CRP: Acute  ESR: Chronic) No results found for: "CRP", "ESRSEDRATE", "LATICACIDVEN"   Rheumatology No results found for: "RF", "ANA", "LABURIC", "URICUR", "LYMEIGGIGMAB", "LYMEABIGMQN", "HLAB27"   Coagulation Lab Results  Component Value Date   PLT 211 11/24/2016     Cardiovascular Lab Results  Component Value Date    HGB 11.9 (L) 11/24/2016   HCT 36.0 11/24/2016     Screening No results found for: "SARSCOV2NAA", "COVIDSOURCE", "STAPHAUREUS", "MRSAPCR", "HCVAB", "HIV", "PREGTESTUR"   Cancer No results found for: "CEA", "CA125", "LABCA2"   Allergens No results found for: "ALMOND", "APPLE", "ASPARAGUS", "AVOCADO", "BANANA", "BARLEY", "BASIL", "BAYLEAF", "GREENBEAN", "LIMABEAN", "WHITEBEAN", "BEEFIGE", "REDBEET", "BLUEBERRY", "BROCCOLI", "CABBAGE", "MELON", "CARROT", "CASEIN", "CASHEWNUT", "CAULIFLOWER", "CELERY"     Note: Lab results reviewed.  PFSH  Drug: Ms. Michael  reports no history of drug use. Alcohol:  reports no history of alcohol use. Tobacco:  reports that she has never smoked. She has never used smokeless tobacco. Medical:  has a past medical history of Arthritis, Diverticulitis, GERD (gastroesophageal reflux disease), Hypertension, and Thyroid disease. Family: family history is not on file.  Past Surgical History:  Procedure Laterality Date   CATARACT EXTRACTION Bilateral    CHOLECYSTECTOMY     COLON SURGERY     EXCISION MORTON'S NEUROMA Left    HERNIA REPAIR     JOINT REPLACEMENT     ROTATOR CUFF REPAIR Right    Active Ambulatory Problems    Diagnosis Date Noted   Lumbar spondylosis 10/12/2022   Spinal stenosis, lumbar region, with neurogenic claudication 10/12/2022   Lumbar degenerative disc disease 10/12/2022   Chronic pain syndrome 10/12/2022   Resolved Ambulatory Problems    Diagnosis Date Noted   No Resolved Ambulatory Problems   Past Medical History:  Diagnosis Date   Arthritis    Diverticulitis    GERD (gastroesophageal reflux disease)    Hypertension    Thyroid disease    Constitutional Exam  General appearance: Well nourished, well developed, and well hydrated. In no apparent acute distress Vitals:   10/12/22 1018  BP: (!) 149/75  Pulse: 70  Resp: 16  Temp: (!) 97.3 F (36.3 C)  TempSrc: Temporal  SpO2: 94%  Weight: 170 lb (77.1 kg)  Height: 5'  (1.524 m)   BMI Assessment: Estimated body mass index is 33.2 kg/m as calculated from the following:   Height as of this encounter: 5' (1.524 m).   Weight as of this encounter: 170 lb (77.1 kg).  BMI interpretation table: BMI level Category Range association with higher incidence of chronic pain  <18 kg/m2 Underweight   18.5-24.9 kg/m2 Ideal body weight   25-29.9 kg/m2 Overweight Increased incidence by 20%  30-34.9 kg/m2 Obese (Class I) Increased incidence by 68%  35-39.9 kg/m2 Severe obesity (Class II) Increased incidence by 136%  >40 kg/m2 Extreme obesity (Class III) Increased incidence by 254%   Patient's current BMI Ideal Body weight  Body mass index is 33.2 kg/m. Ideal body weight: 45.5 kg (100 lb 4.9 oz) Adjusted ideal body weight: 58.1  kg (128 lb 3 oz)   BMI Readings from Last 4 Encounters:  10/12/22 33.20 kg/m  03/03/21 33.99 kg/m  11/24/16 34.01 kg/m   Wt Readings from Last 4 Encounters:  10/12/22 170 lb (77.1 kg)  03/03/21 179 lb 14.3 oz (81.6 kg)  11/24/16 180 lb (81.6 kg)    Psych/Mental status: Alert, oriented x 3 (person, place, & time)       Eyes: PERLA Respiratory: No evidence of acute respiratory distress  Thoracic Spine Area Exam  Skin & Axial Inspection: No masses, redness, or swelling Alignment: Symmetrical Functional ROM: Pain restricted ROM Stability: No instability detected Muscle Tone/Strength: Functionally intact. No obvious neuro-muscular anomalies detected. Sensory (Neurological): Musculoskeletal pain pattern Muscle strength & Tone: No palpable anomalies Lumbar Spine Area Exam  Skin & Axial Inspection: Lumbar Scoliosis Alignment: Symmetrical Functional ROM: Pain restricted ROM affecting both sides Stability: No instability detected Muscle Tone/Strength: Functionally intact. No obvious neuro-muscular anomalies detected. Sensory (Neurological): Musculoskeletal pain pattern Palpation: No palpable anomalies       Provocative  Tests: Hyperextension/rotation test: (+) bilaterally for facet joint pain. Lumbar quadrant test (Kemp's test): (+) bilaterally for facet joint pain.  Gait & Posture Assessment  Ambulation: Limited Gait: Relatively normal for age and body habitus Posture: Difficulty standing up straight, due to pain  Lower Extremity Exam    Side: Right lower extremity  Side: Left lower extremity  Stability: No instability observed          Stability: No instability observed          Skin & Extremity Inspection: Skin color, temperature, and hair growth are WNL. No peripheral edema or cyanosis. No masses, redness, swelling, asymmetry, or associated skin lesions. No contractures.  Skin & Extremity Inspection: Skin color, temperature, and hair growth are WNL. No peripheral edema or cyanosis. No masses, redness, swelling, asymmetry, or associated skin lesions. No contractures.  Functional ROM: Unrestricted ROM                  Functional ROM: Unrestricted ROM                  Muscle Tone/Strength: Functionally intact. No obvious neuro-muscular anomalies detected.  Muscle Tone/Strength: Functionally intact. No obvious neuro-muscular anomalies detected.  Sensory (Neurological): Neurogenic pain pattern        Sensory (Neurological): Neurogenic pain pattern        DTR: Patellar: deferred today Achilles: deferred today Plantar: deferred today  DTR: Patellar: deferred today Achilles: deferred today Plantar: deferred today  Palpation: No palpable anomalies  Palpation: No palpable anomalies    Assessment  Primary Diagnosis & Pertinent Problem List: The primary encounter diagnosis was Lumbar facet arthropathy. Diagnoses of Spinal stenosis, lumbar region, with neurogenic claudication, Lumbar spondylosis, Lumbar degenerative disc disease, Primary osteoarthritis of right knee, and Chronic pain syndrome were also pertinent to this visit.  Visit Diagnosis (New problems to examiner): 1. Lumbar facet arthropathy   2.  Spinal stenosis, lumbar region, with neurogenic claudication   3. Lumbar spondylosis   4. Lumbar degenerative disc disease   5. Primary osteoarthritis of right knee   6. Chronic pain syndrome    Plan of Care (Initial workup plan)   Problem-specific plan:  Lumbar spondylosis Bryley Mamer has a history of greater than 3 months of moderate to severe pain which is resulted in functional impairment.  The patient has tried various conservative therapeutic options such as NSAIDs, Tylenol, muscle relaxants, physical therapy which was inadequately effective.  Patient's pain  is predominantly axial with physical exam and xray findings suggestive of facet arthropathy. Lumbar facet medial branch nerve blocks were discussed with the patient.  Risks and benefits were reviewed.  Patient would like to proceed with bilateral L3, L4, L5 medial branch nerve block.   Spinal stenosis, lumbar region, with neurogenic claudication Recommend L-Mri and consider MILD  Lumbar degenerative disc disease Stable   Imaging Orders         MR LUMBAR SPINE WO CONTRAST      Procedure Orders         LUMBAR FACET(MEDIAL BRANCH NERVE BLOCK) MBNB     Pharmacotherapy (current): Medications ordered:  No orders of the defined types were placed in this encounter.  Medications administered during this visit: Manjot Peiffer. Mcguffie had no medications administered during this visit.   Analgesic Pharmacotherapy:  Opioid Analgesics: For patients currently taking or requesting to take opioid analgesics, in accordance with [facility] Guidelines, we will assess their risks and indications for the use of these substances. After completing our evaluation, we may offer recommendations, but we no longer take patients for medication management. The prescribing physician will ultimately decide, based on his/her training and level of comfort whether to adopt any of the recommendations, including whether or not to  prescribe such medicines.  Membrane stabilizer: To be determined at a later time  Muscle relaxant: To be determined at a later time  NSAID: To be determined at a later time  Other analgesic(s): To be determined at a later time   Interventional management options: Ms. Mcculley was informed that there is no guarantee that she would be a candidate for interventional therapies. The decision will be based on the results of diagnostic studies, as well as Ms. Taney's risk profile.  Procedure(s) under consideration:  L-MBNB SI-J MILD    Provider-requested follow-up: Return in about 8 days (around 10/20/2022) for B/L L3, 4, 5 MBNB, in clinic NS.  Future Appointments  Date Time Provider Department Center  10/19/2022 10:00 AM [facility]    Duration of encounter: .  Total time on encounter, as per AMA guidelines included both the face-to-face and non-face-to-face time personally spent by the physician and/or other qualified health care professional(s) on the day of the encounter (includes time in activities that require the physician or other qualified health care professional and does not include time in activities normally performed by clinical staff). Physician's time may include the following activities when performed: Preparing to see the patient (e.g., pre-charting review of records, searching for previously ordered imaging, lab work, and nerve conduction tests) Review of prior analgesic pharmacotherapies. Reviewing PMP Interpreting ordered tests (e.g., lab work, imaging, nerve conduction tests) Performing post-procedure evaluations, including interpretation of diagnostic procedures Obtaining and/or reviewing separately obtained history Performing a medically appropriate examination and/or evaluation Counseling and educating the patient/family/caregiver Ordering medications, tests, or procedures Referring and communicating with other health care professionals (when not  separately reported) Documenting clinical information in the electronic or other health record Independently interpreting results (not separately reported) and communicating results to the patient/ family/caregiver Care coordination (not separately reported)  Note by: Edward Jolly, MD (TTS technology used. I apologize for any typographical errors that were not detected and corrected.) Date: 10/12/2022; Time: 11:27 AM

## 2022-10-19 ENCOUNTER — Ambulatory Visit
Admission: RE | Admit: 2022-10-19 | Discharge: 2022-10-19 | Disposition: A | Discharge status: Home / Self Care | Payer: Medicare Other | Source: Ambulatory Visit | Attending: Student in an Organized Health Care Education/Training Program | Admitting: Student in an Organized Health Care Education/Training Program

## 2022-10-19 DIAGNOSIS — M47816 Spondylosis without myelopathy or radiculopathy, lumbar region: Secondary | ICD-10-CM | POA: Insufficient documentation

## 2022-10-19 DIAGNOSIS — G894 Chronic pain syndrome: Secondary | ICD-10-CM | POA: Insufficient documentation

## 2022-10-19 DIAGNOSIS — M48062 Spinal stenosis, lumbar region with neurogenic claudication: Secondary | ICD-10-CM | POA: Insufficient documentation

## 2022-10-19 DIAGNOSIS — M5136 Other intervertebral disc degeneration, lumbar region: Secondary | ICD-10-CM | POA: Diagnosis present

## 2022-11-17 ENCOUNTER — Ambulatory Visit
Admission: RE | Admit: 2022-11-17 | Discharge: 2022-11-17 | Disposition: A | Discharge status: Home / Self Care | Payer: Medicare Other | Source: Ambulatory Visit | Attending: Student in an Organized Health Care Education/Training Program | Admitting: Student in an Organized Health Care Education/Training Program

## 2022-11-17 ENCOUNTER — Encounter: Payer: Self-pay | Admitting: Student in an Organized Health Care Education/Training Program

## 2022-11-17 ENCOUNTER — Ambulatory Visit
Payer: Medicare Other | Attending: Student in an Organized Health Care Education/Training Program | Admitting: Student in an Organized Health Care Education/Training Program

## 2022-11-17 DIAGNOSIS — M5136 Other intervertebral disc degeneration, lumbar region: Secondary | ICD-10-CM | POA: Diagnosis present

## 2022-11-17 DIAGNOSIS — G894 Chronic pain syndrome: Secondary | ICD-10-CM | POA: Insufficient documentation

## 2022-11-17 DIAGNOSIS — M48062 Spinal stenosis, lumbar region with neurogenic claudication: Secondary | ICD-10-CM | POA: Diagnosis present

## 2022-11-17 DIAGNOSIS — M47816 Spondylosis without myelopathy or radiculopathy, lumbar region: Secondary | ICD-10-CM | POA: Diagnosis not present

## 2022-11-17 MED ORDER — DEXAMETHASONE SODIUM PHOSPHATE 10 MG/ML IJ SOLN
10.0000 mg | Freq: Once | INTRAMUSCULAR | Status: AC
  Administered 2022-11-17: 10 mg
  Filled 2022-11-17: qty 1

## 2022-11-17 MED ORDER — ROPIVACAINE HCL 2 MG/ML IJ SOLN
9.0000 mL | Freq: Once | INTRAMUSCULAR | Status: DC
  Filled 2022-11-17: qty 20

## 2022-11-17 MED ORDER — ROPIVACAINE HCL 2 MG/ML IJ SOLN
9.0000 mL | Freq: Once | INTRAMUSCULAR | Status: AC
  Administered 2022-11-17: 9 mL via PERINEURAL
  Filled 2022-11-17: qty 20

## 2022-11-17 MED ORDER — LIDOCAINE HCL 2 % IJ SOLN
20.0000 mL | Freq: Once | INTRAMUSCULAR | Status: AC
  Administered 2022-11-17: 400 mg
  Filled 2022-11-17: qty 40

## 2022-11-17 NOTE — Patient Instructions (Signed)

## 2022-11-17 NOTE — Progress Notes (Signed)
Safety precautions to be maintained throughout the outpatient stay will include: orient to surroundings, keep bed in low position, maintain call bell within reach at all times, provide assistance with transfer out of bed and ambulation.  

## 2022-11-17 NOTE — Progress Notes (Signed)
PROVIDER NOTE: Interpretation of information contained herein should be left to medically-trained personnel. Specific patient instructions are provided elsewhere under "Patient Instructions" section of medical record. This document was created in part using STT-dictation technology, any transcriptional errors that may result from this process are unintentional.  Patient: Andrea Blake Type: Established DOB: 18-Mar-1931 MRN: [id number] PCP: [facility] [place] Primary  Service: Procedure DOS: 11/17/2022 Setting: Ambulatory Location: Ambulatory outpatient facility Delivery: Face-to-face Provider: Edward Jolly, MD Specialty: Interventional Pain Management Specialty designation: 09 Location: Outpatient facility Ref. Prov.: Edward Jolly, MD       Interventional Therapy   Procedure: Lumbar Facet, Medial Branch Block(s) #1  Laterality: Bilateral  Level: L3, L4, and L5 Medial Branch Level(s). Injecting these levels blocks the L3-4 and L4-5 lumbar facet joints. Imaging: Fluoroscopic guidance         Anesthesia: Local anesthesia (1-2% Lidocaine) DOS: 11/17/2022 Performed by: Edward Jolly, MD  Primary Purpose: Diagnostic/Therapeutic Indications: Low back pain severe enough to impact quality of life or function. 1. Lumbar facet arthropathy   2. Spinal stenosis, lumbar region, with neurogenic claudication   3. Lumbar spondylosis   4. Lumbar degenerative disc disease   5. Chronic pain syndrome    NAS-11 Pain score:   Pre-procedure: 7 /10   Post-procedure: 0-No pain/10     Position / Prep / Materials:  Position: Prone  Prep solution: DuraPrep (Iodine Povacrylex [0.7% available iodine] and Isopropyl Alcohol, 74% w/w) Area Prepped: Posterolateral Lumbosacral Spine (Wide prep: From the lower border of the scapula down to the end of the tailbone and from flank to flank.)  Materials:  Tray: Block Needle(s):  Type: Spinal  Gauge (G): 22  Length: 3.5-in Qty: 2      H&P (Pre-op  Assessment):  Ms. Kirchmann is a 89 y.o. (year old), female patient, seen today for interventional treatment. She  has a past surgical history that includes Joint replacement; Colon surgery; Hernia repair; Cholecystectomy; Cataract extraction (Bilateral); Rotator cuff repair (Right); and Excision Morton's neuroma (Left). Ms. Kiester has a current medication list which includes the following prescription(s): acetaminophen, amlodipine, bisacodyl, oyster shell calcium/d, duloxetine, hydrochlorothiazide, levothyroxine, losartan, lubiprostone, magnesium oxide, melatonin, omega-3 fish oil, omeprazole, wheat dextrin, famotidine, and tramadol, and the following Facility-Administered Medications: ropivacaine (pf) 2 mg/ml (0.2%). Her primarily concern today is the Back Pain (Bilateral lumbar )  Initial Vital Signs:  Pulse/HCG Rate: 61  Temp: (!) 97.3 F (36.3 C) Resp: 16 BP: (!) 152/56 SpO2: 97 %  BMI: Estimated body mass index is 32.22 kg/m as calculated from the following:   Height as of this encounter: 5' (1.524 m).   Weight as of this encounter: 165 lb (74.8 kg).  Risk Assessment: Allergies: Reviewed. She is allergic to other, shellfish allergy, sulfa antibiotics, sulfasalazine, amitriptyline, flagyl [metronidazole], gabapentin, penicillins, ciprofloxacin, and levofloxacin.  Allergy Precautions: None required Coagulopathies: Reviewed. None identified.  Blood-thinner therapy: None at this time Active Infection(s): Reviewed. None identified. Ms. Munn is afebrile  Site Confirmation: Ms. Lanzi was asked to confirm the procedure and laterality before marking the site Procedure checklist: Completed Consent: Before the procedure and under the influence of no sedative(s), amnesic(s), or anxiolytics, the patient was informed of the treatment options, risks and possible complications. To fulfill our ethical and legal obligations, as recommended by the American Medical Association's Code of Ethics, I  have informed the patient of my clinical impression; the nature and purpose of the treatment or procedure; the risks, benefits, and possible complications of the intervention; the alternatives, including  doing nothing; the risk(s) and benefit(s) of the alternative treatment(s) or procedure(s); and the risk(s) and benefit(s) of doing nothing. The patient was provided information about the general risks and possible complications associated with the procedure. These may include, but are not limited to: failure to achieve desired goals, infection, bleeding, organ or nerve damage, allergic reactions, paralysis, and death. In addition, the patient was informed of those risks and complications associated to Spine-related procedures, such as failure to decrease pain; infection (i.e.: Meningitis, epidural or intraspinal abscess); bleeding (i.e.: epidural hematoma, subarachnoid hemorrhage, or any other type of intraspinal or peri-dural bleeding); organ or nerve damage (i.e.: Any type of peripheral nerve, nerve root, or spinal cord injury) with subsequent damage to sensory, motor, and/or autonomic systems, resulting in permanent pain, numbness, and/or weakness of one or several areas of the body; allergic reactions; (i.e.: anaphylactic reaction); and/or death. Furthermore, the patient was informed of those risks and complications associated with the medications. These include, but are not limited to: allergic reactions (i.e.: anaphylactic or anaphylactoid reaction(s)); adrenal axis suppression; blood sugar elevation that in diabetics may result in ketoacidosis or comma; water retention that in patients with history of congestive heart failure may result in shortness of breath, pulmonary edema, and decompensation with resultant heart failure; weight gain; swelling or edema; medication-induced neural toxicity; particulate matter embolism and blood vessel occlusion with resultant organ, and/or nervous system infarction;  and/or aseptic necrosis of one or more joints. Finally, the patient was informed that Medicine is not an exact science; therefore, there is also the possibility of unforeseen or unpredictable risks and/or possible complications that may result in a catastrophic outcome. The patient indicated having understood very clearly. We have given the patient no guarantees and we have made no promises. Enough time was given to the patient to ask questions, all of which were answered to the patient's satisfaction. Ms. Shkolnik has indicated that she wanted to continue with the procedure. Attestation: I, the ordering provider, attest that I have discussed with the patient the benefits, risks, side-effects, alternatives, likelihood of achieving goals, and potential problems during recovery for the procedure that I have provided informed consent. Date  Time: 11/17/2022 10:33 AM   Pre-Procedure Preparation:  Monitoring: As per clinic protocol. Respiration, ETCO2, SpO2, BP, heart rate and rhythm monitor placed and checked for adequate function Safety Precautions: Patient was assessed for positional comfort and pressure points before starting the procedure. Time-out: I initiated and conducted the "Time-out" before starting the procedure, as per protocol. The patient was asked to participate by confirming the accuracy of the "Time Out" information. Verification of the correct person, site, and procedure were performed and confirmed by me, the nursing staff, and the patient. "Time-out" conducted as per Joint Commission's Universal Protocol (UP.01.01.01). Time: 1124 Start Time: 1125 hrs.  Description of Procedure:          Laterality: (see above) Targeted Levels: (see above)  Safety Precautions: Aspiration looking for blood return was conducted prior to all injections. At no point did we inject any substances, as a needle was being advanced. Before injecting, the patient was told to immediately notify me if she was  experiencing any new onset of "ringing in the ears, or metallic taste in the mouth". No attempts were made at seeking any paresthesias. Safe injection practices and needle disposal techniques used. Medications properly checked for expiration dates. SDV (single dose vial) medications used. After the completion of the procedure, all disposable equipment used was discarded in the proper designated  medical waste containers. Local Anesthesia: Protocol guidelines were followed. The patient was positioned over the fluoroscopy table. The area was prepped in the usual manner. The time-out was completed. The target area was identified using fluoroscopy. A 12-in long, straight, sterile hemostat was used with fluoroscopic guidance to locate the targets for each level blocked. Once located, the skin was marked with an approved surgical skin marker. Once all sites were marked, the skin (epidermis, dermis, and hypodermis), as well as deeper tissues (fat, connective tissue and muscle) were infiltrated with a small amount of a short-acting local anesthetic, loaded on a 10cc syringe with a 25G, 1.5-in  Needle. An appropriate amount of time was allowed for local anesthetics to take effect before proceeding to the next step. Local Anesthetic: Lidocaine 2.0% The unused portion of the local anesthetic was discarded in the proper designated containers. Technical description of process:   L3 Medial Branch Nerve Block (MBB): The target area for the L3 medial branch is at the junction of the postero-lateral aspect of the superior articular process and the superior, posterior, and medial edge of the transverse process of L4. Under fluoroscopic guidance, a Quincke needle was inserted until contact was made with os over the superior postero-lateral aspect of the pedicular shadow (target area). After negative aspiration for blood, 2mL of the nerve block solution was injected without difficulty or complication. The needle was removed  intact. L4 Medial Branch Nerve Block (MBB): The target area for the L4 medial branch is at the junction of the postero-lateral aspect of the superior articular process and the superior, posterior, and medial edge of the transverse process of L5. Under fluoroscopic guidance, a Quincke needle was inserted until contact was made with os over the superior postero-lateral aspect of the pedicular shadow (target area). After negative aspiration for blood, 2 mL of the nerve block solution was injected without difficulty or complication. The needle was removed intact. L5 Medial Branch Nerve Block (MBB): The target area for the L5 medial branch is at the junction of the postero-lateral aspect of the superior articular process and the superior, posterior, and medial edge of the sacral ala. Under fluoroscopic guidance, a Quincke needle was inserted until contact was made with os over the superior postero-lateral aspect of the pedicular shadow (target area). After negative aspiration for blood, 2mL of the nerve block solution was injected without difficulty or complication. The needle was removed intact.   Once the entire procedure was completed, the treated area was cleaned, making sure to leave some of the prepping solution back to take advantage of its long term bactericidal properties.         Illustration of the posterior view of the lumbar spine and the posterior neural structures. Laminae of L2 through S1 are labeled. DPRL5, dorsal primary ramus of L5; DPRS1, dorsal primary ramus of S1; DPR3, dorsal primary ramus of L3; FJ, facet (zygapophyseal) joint L3-L4; I, inferior articular process of L4; LB1, lateral branch of dorsal primary ramus of L1; IAB, inferior articular branches from L3 medial branch (supplies L4-L5 facet joint); IBP, intermediate branch plexus; MB3, medial branch of dorsal primary ramus of L3; NR3, third lumbar nerve root; S, superior articular process of L5; SAB, superior articular branches  from L4 (supplies L4-5 facet joint also); TP3, transverse process of L3.   Facet Joint Innervation (* possible contribution)  L1-2 T12, L1 (L2*)  Medial Branch  L2-3 L1, L2 (L3*)         "          "  L3-4 L2, L3 (L4*)         "          "  L4-5 L3, L4 (L5*)         "          "  L5-S1 L4, L5, S1          "          "    Vitals:   11/17/22 1125 11/17/22 1130 11/17/22 1134 11/17/22 1140  BP: (!) 135/59 137/64 (!) 146/66 (!) 158/73  Pulse: 77 75 77   Resp: 18 20 16    Temp:      TempSrc:      SpO2: 97% 98% 98%   Weight:      Height:         End Time: 1134 hrs.  Imaging Guidance (Spinal):          Type of Imaging Technique: Fluoroscopy Guidance (Spinal) Indication(s): Assistance in needle guidance and placement for procedures requiring needle placement in or near specific anatomical locations not easily accessible without such assistance. Exposure Time: Please see nurses notes. Contrast: None used. Fluoroscopic Guidance: I was personally present during the use of fluoroscopy. "Tunnel Vision Technique" used to obtain the best possible view of the target area. Parallax error corrected before commencing the procedure. "Direction-depth-direction" technique used to introduce the needle under continuous pulsed fluoroscopy. Once target was reached, antero-posterior, oblique, and lateral fluoroscopic projection used confirm needle placement in all planes. Images permanently stored in EMR. Interpretation: No contrast injected. I personally interpreted the imaging intraoperatively. Adequate needle placement confirmed in multiple planes. Permanent images saved into the patient's record.  Post-operative Assessment:  Post-procedure Vital Signs:  Pulse/HCG Rate: 77  Temp: (!) 97.3 F (36.3 C) Resp: 16 BP: (!) 158/73 SpO2: 98 %  EBL: None  Complications: No immediate post-treatment complications observed by team, or reported by patient.  Note: The patient tolerated the entire procedure  well. A repeat set of vitals were taken after the procedure and the patient was kept under observation following institutional policy, for this type of procedure. Post-procedural neurological assessment was performed, showing return to baseline, prior to discharge. The patient was provided with post-procedure discharge instructions, including a section on how to identify potential problems. Should any problems arise concerning this procedure, the patient was given instructions to immediately contact us, at any time, without hesitation. In any case, we plan to contact the patient by telephone for a follow-up status report regarding this interventional procedure.  Comments:  No additional relevant information.  Plan of Care (POC)  Orders:  Orders Placed This Encounter  Procedures   DG PAIN CLINIC C-ARM 1-60 MIN NO REPORT    Intraoperative interpretation by procedural physician at [facility].    Standing Status:   Standing    Number of Occurrences:   1    Order Specific Question:   Reason for exam:    Answer:   Assistance in needle guidance and placement for procedures requiring needle placement in or near specific anatomical locations not easily accessible without such assistance.   Medications ordered for procedure: Meds ordered this encounter  Medications   lidocaine (XYLOCAINE) 2 % (with pres) injection 400 mg   dexamethasone (DECADRON) injection 10 mg   dexamethasone (DECADRON) injection 10 mg   ropivacaine (PF) 2 mg/mL (0.2%) (NAROPIN) injection 9 mL   ropivacaine (PF) 2 mg/mL (0.2%) (NAROPIN) injection 9 mL   Medications administered: We administered lidocaine,  dexamethasone, dexamethasone, and ropivacaine (PF) 2 mg/mL (0.2%).  See the medical record for exact dosing, route, and time of administration.  Follow-up plan:   Return in about 4 weeks (around 12/15/2022) for In person 4 weeks f/u PPE .       BLF L3-5 11/17/22    Recent Visits Date Type Provider Dept   10/12/22 Office Visit Edward Jolly, MD [facility]  Showing recent visits within past 90 days and meeting all other requirements Today's Visits Date Type Provider Dept  11/17/22 Procedure visit Edward Jolly, MD [facility]  Showing today's visits and meeting all other requirements Future Appointments Date Type Provider Dept  12/15/22 Appointment Edward Jolly, MD [facility]  Showing future appointments within next 90 days and meeting all other requirements  Disposition: Discharge home  Discharge (Date  Time): 11/17/2022; 1144 hrs.   Primary Care Physician: [facility], [place] Primary Location: [facility] Note by: Edward Jolly, MD (TTS technology used. I apologize for any typographical errors that were not detected and corrected.) Date: 11/17/2022; Time: 1:12 PM  Disclaimer:  Medicine is not an Visual merchandiser. The only guarantee in medicine is that nothing is guaranteed. It is important to note that the decision to proceed with this intervention was based on the information collected from the patient. The Data and conclusions were drawn from the patient's questionnaire, the interview, and the physical examination. Because the information was provided in large part by the patient, it cannot be guaranteed that it has not been purposely or unconsciously manipulated. Every effort has been made to obtain as much relevant data as possible for this evaluation. It is important to note that the conclusions that lead to this procedure are derived in large part from the available data. Always take into account that the treatment will also be dependent on availability of resources and existing treatment guidelines, considered by other Pain Management Practitioners as being common knowledge and practice, at the time of the intervention. For Medico-Legal purposes, it is also important to point out that variation in procedural techniques and pharmacological  choices are the acceptable norm. The indications, contraindications, technique, and results of the above procedure should only be interpreted and judged by a Board-Certified Interventional Pain Specialist with extensive familiarity and expertise in the same exact procedure and technique.

## 2022-11-18 ENCOUNTER — Telehealth: Payer: Self-pay

## 2022-11-18 NOTE — Telephone Encounter (Signed)
Post procedure follow up.  Patient states she is doing ok.  

## 2022-12-04 IMAGING — MG MM DIGITAL SCREENING BILAT W/ TOMO AND CAD
8 series · 8 of 24 positions shown · non-contrast
Comparison: Previous exam(s).

CLINICAL DATA: Screening.

EXAM:
DIGITAL SCREENING BILATERAL MAMMOGRAM WITH TOMOSYNTHESIS AND CAD
TECHNIQUE: Bilateral screening digital craniocaudal and mediolateral oblique
mammograms were obtained. Bilateral screening digital breast
tomosynthesis was performed. The images were evaluated with
computer-aided detection.

[R CC synth-2D]
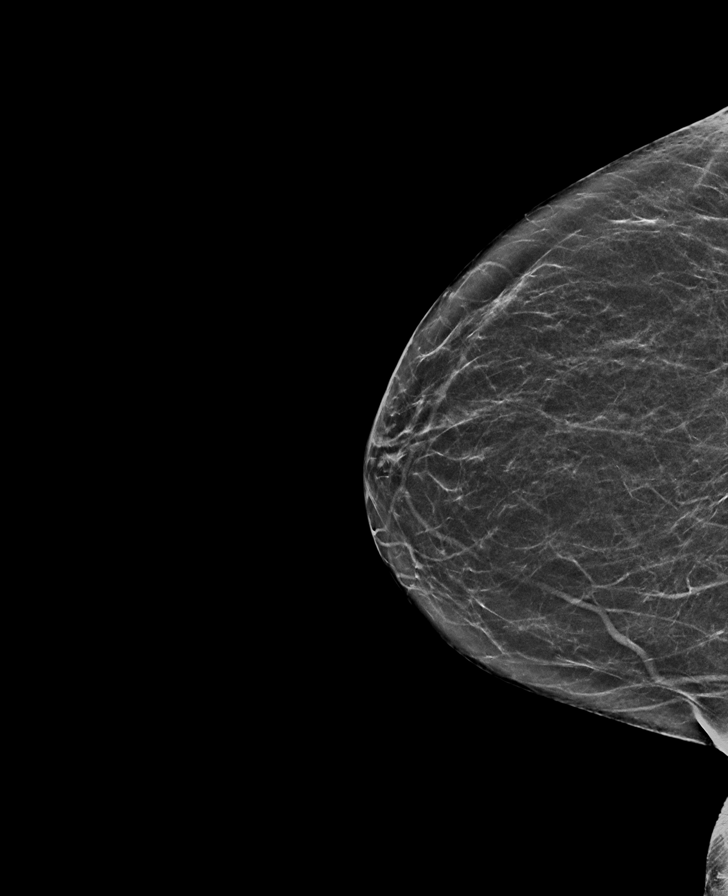

[L MLO synth-2D]
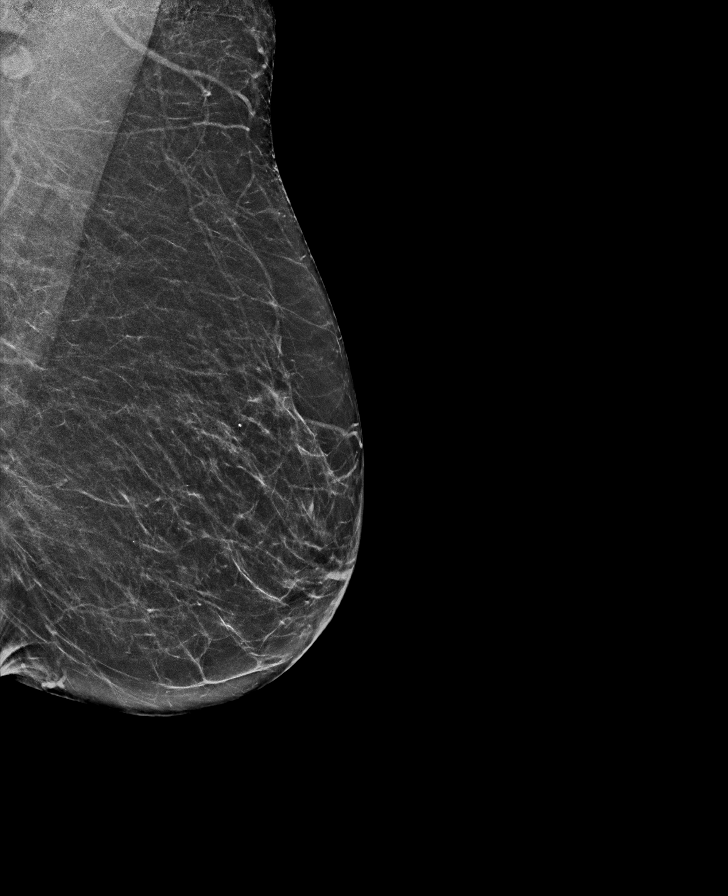

[L CC synth-2D]
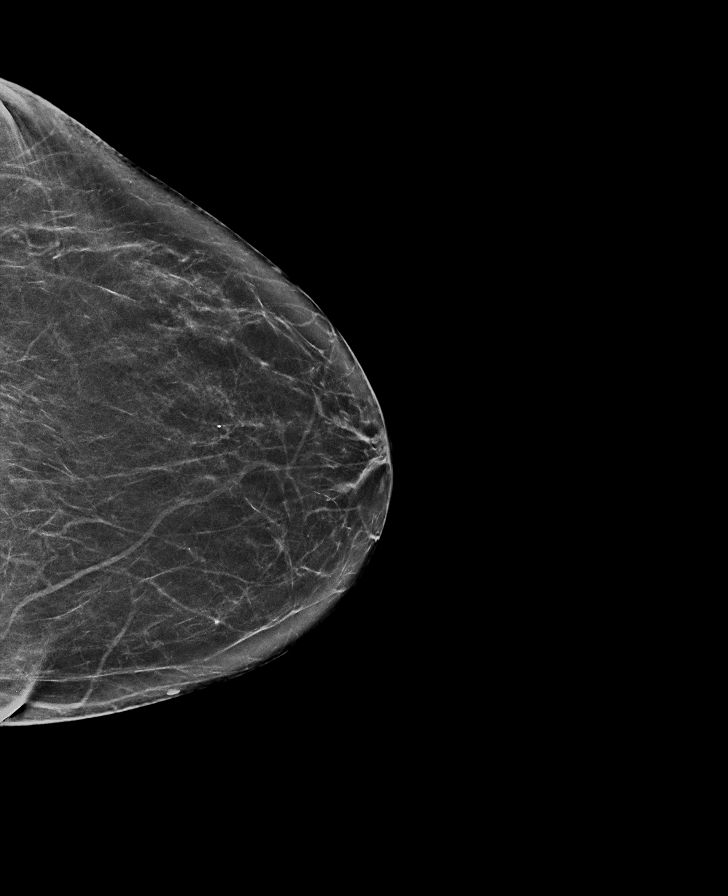

[R MLO synth-2D]
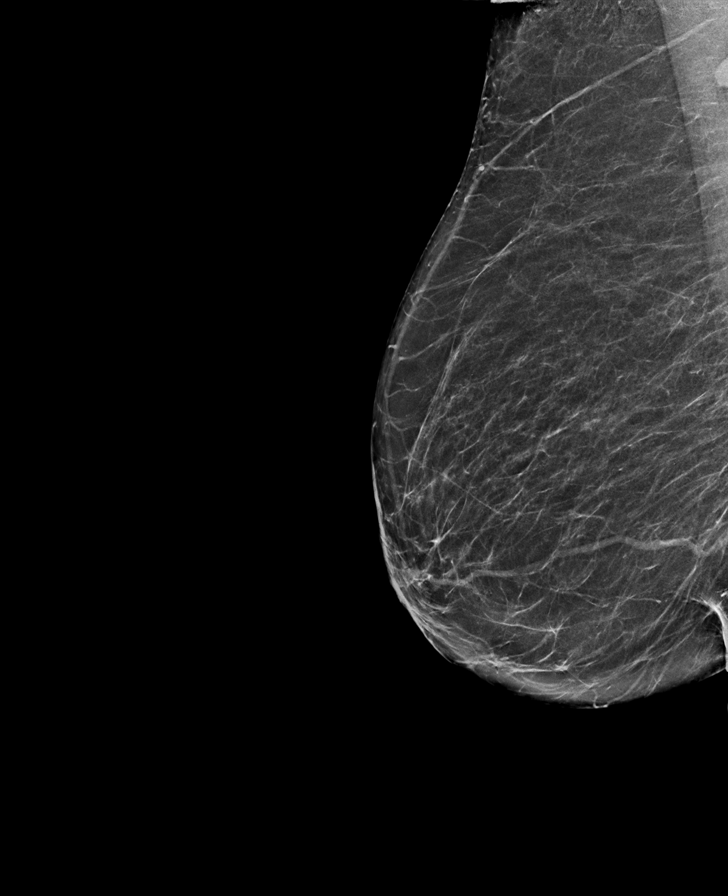

[L MLO tomo · tomo slice 31/62.0]
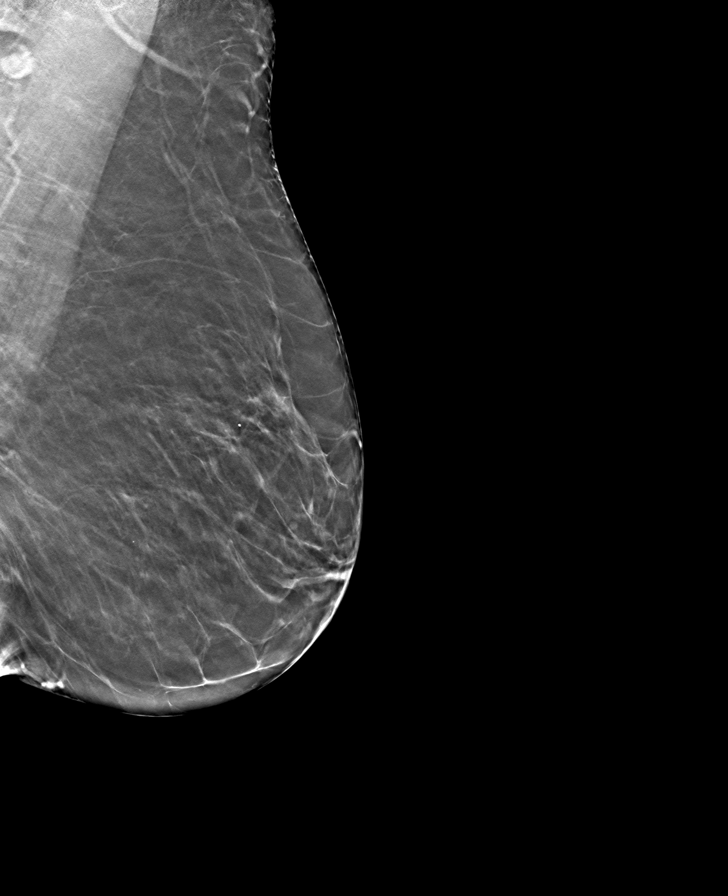

[L CC tomo · tomo slice 32/63.0]
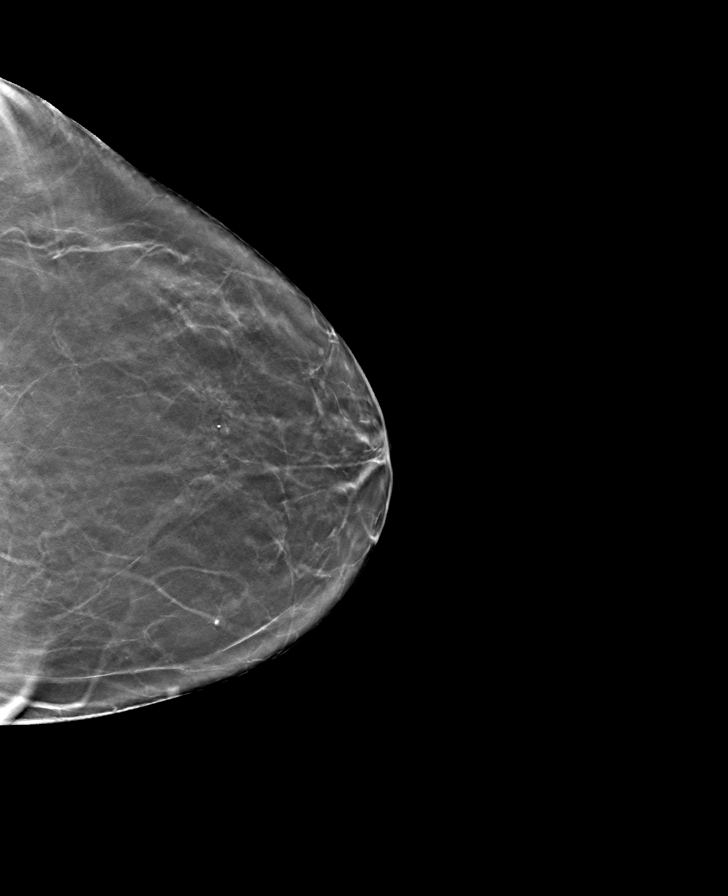

[R MLO tomo · tomo slice 31/62.0]
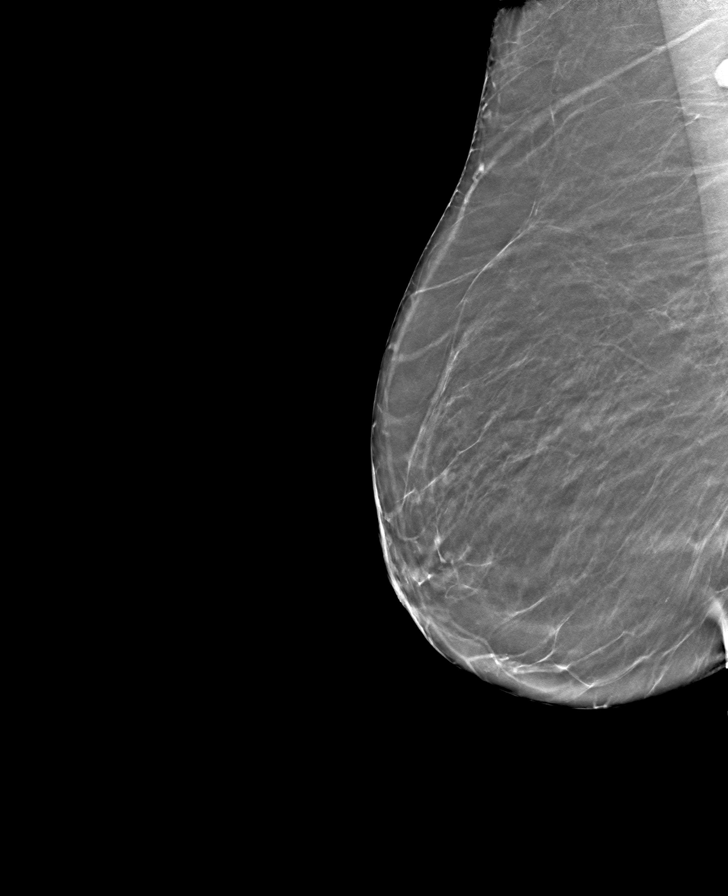

[R CC tomo · tomo slice 28/55.0]
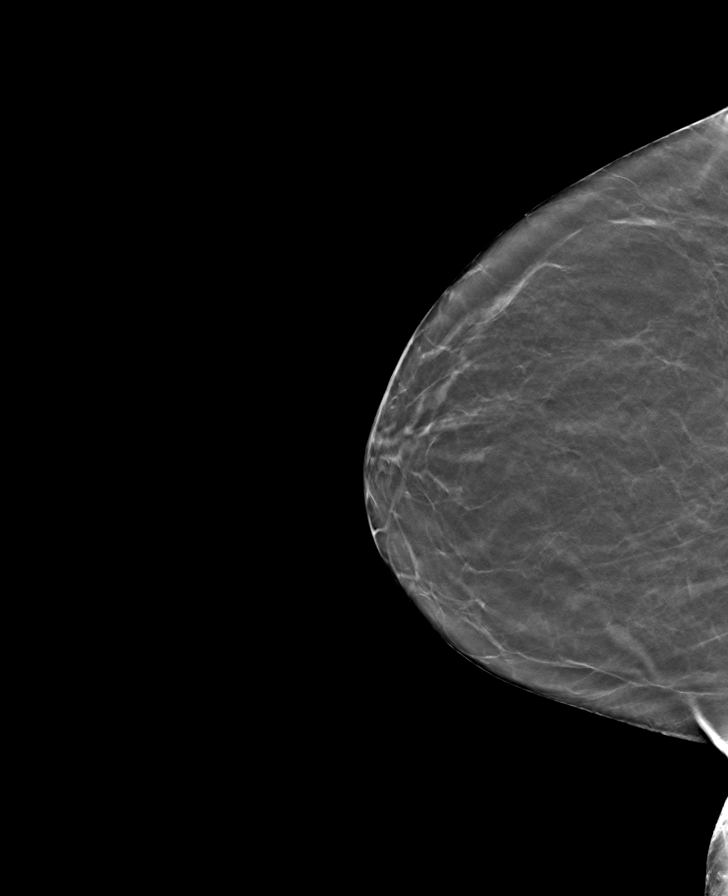

[8 of 24 positions shown; findings below may reference images not displayed]

ACR Breast Density Category b: There are scattered areas of
fibroglandular density.
FINDINGS: There are no findings suspicious for malignancy.
IMPRESSION: No mammographic evidence of malignancy. A result letter of this
screening mammogram will be mailed directly to the patient.

RECOMMENDATION:
Screening mammogram in one year. (Code:51-O-LD2)

BI-RADS CATEGORY  1: Negative.

## 2022-12-15 ENCOUNTER — Encounter: Payer: Self-pay | Admitting: Student in an Organized Health Care Education/Training Program

## 2022-12-15 ENCOUNTER — Ambulatory Visit
Payer: Medicare Other | Attending: Student in an Organized Health Care Education/Training Program | Admitting: Student in an Organized Health Care Education/Training Program

## 2022-12-15 VITALS — BP 141/83 | HR 81 | Temp 97.3°F | Resp 18 | Ht 60.0 in | Wt 165.0 lb

## 2022-12-15 DIAGNOSIS — M48062 Spinal stenosis, lumbar region with neurogenic claudication: Secondary | ICD-10-CM | POA: Insufficient documentation

## 2022-12-15 DIAGNOSIS — M5416 Radiculopathy, lumbar region: Secondary | ICD-10-CM | POA: Diagnosis present

## 2022-12-15 DIAGNOSIS — G894 Chronic pain syndrome: Secondary | ICD-10-CM | POA: Diagnosis present

## 2022-12-15 DIAGNOSIS — M5136 Other intervertebral disc degeneration, lumbar region with discogenic back pain only: Secondary | ICD-10-CM | POA: Insufficient documentation

## 2022-12-15 DIAGNOSIS — G8929 Other chronic pain: Secondary | ICD-10-CM | POA: Diagnosis present

## 2022-12-15 MED ORDER — TRAMADOL HCL 50 MG PO TABS: 50.0000 mg | ORAL_TABLET | Freq: Two times a day (BID) | ORAL | 1 refills | Status: DC | PRN

## 2022-12-15 MED ORDER — TRAMADOL HCL 50 MG PO TABS: 50.0000 mg | ORAL_TABLET | Freq: Two times a day (BID) | ORAL | 1 refills | Status: AC | PRN

## 2022-12-15 NOTE — Progress Notes (Signed)
Safety precautions to be maintained throughout the outpatient stay will include: orient to surroundings, keep bed in low position, maintain call bell within reach at all times, provide assistance with transfer out of bed and ambulation.  

## 2022-12-15 NOTE — Progress Notes (Signed)
PROVIDER NOTE: Information contained herein reflects review and annotations entered in association with encounter. Interpretation of such information and data should be left to medically-trained personnel. Information provided to patient can be located elsewhere in the medical record under "Patient Instructions". Document created using STT-dictation technology, any transcriptional errors that may result from process are unintentional.    Patient: Andrea Blake  Service Category: E/M  Provider: Edward Jolly, MD  DOB: 08/17/1931  DOS: 12/15/2022  Referring Provider: [facility] [place] [facility]  MRN: [id number]  Specialty: Interventional Pain Management  PCP: [facility] [facility]  Type: Established Patient  Setting: Ambulatory outpatient    Location: Office  Delivery: Face-to-face     HPI  Andrea Blake, a 89 y.o. year old female, is here today because of her Spinal stenosis, lumbar region, with neurogenic claudication [M48.062]. Andrea Blake primary complain today is Back Pain   Pain Assessment: Severity of Chronic pain is reported as a 7 /10. Location: Back Lower, Right, Left/radiates from from lower back into right buttocks. Onset: More than a month ago. Quality: Sharp, Sore, Tender, Discomfort, Stabbing. Timing: Constant. Modifying factor(s): sitting and resting. Vitals:  height is 5' (1.524 m) and weight is 165 lb (74.8 kg). Her temporal temperature is 97.3 F (36.3 C) (abnormal). Her blood pressure is 141/83 (abnormal) and her pulse is 81. Her respiration is 18 and oxygen saturation is 96%.  BMI: Estimated body mass index is 32.22 kg/m as calculated from the following:   Height as of this encounter: 5' (1.524 m).   Weight as of this encounter: 165 lb (74.8 kg). Last encounter: 10/12/2022. Last procedure: 11/17/2022.  Reason for encounter:   Post-procedure evaluation   Procedure: Lumbar Facet, Medial Branch Block(s) #1  Laterality: Bilateral  Level: L3, L4, and L5 Medial  Branch Level(s). Injecting these levels blocks the L3-4 and L4-5 lumbar facet joints. Imaging: Fluoroscopic guidance         Anesthesia: Local anesthesia (1-2% Lidocaine) DOS: 11/17/2022 Performed by: Edward Jolly, MD  Primary Purpose: Diagnostic/Therapeutic Indications: Low back pain severe enough to impact quality of life or function. 1. Lumbar facet arthropathy   2. Spinal stenosis, lumbar region, with neurogenic claudication   3. Lumbar spondylosis   4. Lumbar degenerative disc disease   5. Chronic pain syndrome    NAS-11 Pain score:   Pre-procedure: 7 /10   Post-procedure: 0-No pain/10      Effectiveness:  Initial hour after procedure: 100 %  Subsequent 4-6 hours post-procedure: 50 %  Analgesia past initial 6 hours: 0 %  Ongoing improvement:  Analgesic:  0% Function: Back to baseline ROM: Back to baseline  ROS  Constitutional: Denies any fever or chills Gastrointestinal: No reported hemesis, hematochezia, vomiting, or acute GI distress Musculoskeletal:  Low back and right leg pain Neurological: No reported episodes of acute onset apraxia, aphasia, dysarthria, agnosia, amnesia, paralysis, loss of coordination, or loss of consciousness  Medication Review  DULoxetine, Melatonin, Oyster Shell Calcium/D, Wheat Dextrin, acetaminophen, amLODipine, bisacodyl, famotidine, hydrochlorothiazide, levothyroxine, losartan, lubiprostone, magnesium oxide, omega-3 fish oil, omeprazole, and traMADol  History Review  Allergy: Andrea Blake is allergic to other, shellfish allergy, sulfa antibiotics, sulfasalazine, amitriptyline, flagyl [metronidazole], gabapentin, penicillins, ciprofloxacin, and levofloxacin. Drug: Andrea Blake  reports no history of drug use. Alcohol:  reports no history of alcohol use. Tobacco:  reports that she has never smoked. She has never used smokeless tobacco. Social: Andrea Blake  reports that she has never smoked. She has never used smokeless tobacco. She reports  that she does not drink alcohol and does not use drugs. Medical:  has a past medical history of Arthritis, Diverticulitis, GERD (gastroesophageal reflux disease), Hypertension, and Thyroid disease. Surgical: [name]  has a past surgical history that includes Joint replacement; Colon surgery; Hernia repair; Cholecystectomy; Cataract extraction (Bilateral); Rotator cuff repair (Right); and Excision Morton's neuroma (Left). Family: family history is not on file.  Laboratory Chemistry Profile   Renal Lab Results  Component Value Date   BUN 29 (H) 11/24/2016   CREATININE 0.97 11/24/2016   GFRAA >60 11/24/2016   GFRNONAA 53 (L) 11/24/2016    Hepatic No results found for: "AST", "ALT", "ALBUMIN", "ALKPHOS", "HCVAB", "AMYLASE", "LIPASE", "AMMONIA"  Electrolytes Lab Results  Component Value Date   NA 139 11/24/2016   K 3.2 (L) 11/24/2016   CL 104 11/24/2016   CALCIUM 8.7 (L) 11/24/2016    Bone No results found for: "VD25OH", "VD125OH2TOT", "FA2130QM5", "HQ4696EX5", "25OHVITD1", "25OHVITD2", "25OHVITD3", "TESTOFREE", "TESTOSTERONE"  Inflammation (CRP: Acute Phase) (ESR: Chronic Phase) No results found for: "CRP", "ESRSEDRATE", "LATICACIDVEN"       Note: Above Lab results reviewed.  Recent Imaging Review  CLINICAL DATA:  Low back pain, symptoms persist with > 6 wks treatment Low back pain, spondyloarthropathy suspected, xray done   EXAM: MRI LUMBAR SPINE WITHOUT CONTRAST   TECHNIQUE: Multiplanar, multisequence MR imaging of the lumbar spine was performed. No intravenous contrast was administered.   COMPARISON:  None Available.   FINDINGS: Segmentation:  Standard.   Alignment: Leftward curvature of the lumbar spine centered at the L2 level. There is left lateral listhesis of L2 on L3 and L3 on L4. There is stepwise retrolisthesis of L1 on L2, L2 on L3, L3 on L4. There is grade 1 anterolisthesis of L4 on L5.   Vertebrae: No fracture, evidence of discitis, or bone  lesion. Degenerative endplate changes most pronounced at L1 on L2.   Conus medullaris and cauda equina: Conus extends to the L2 level. Conus and cauda equina appear normal.   Paraspinal and other soft tissues: Atrophy of the paraspinal musculature.   Disc levels:   T12-L1: Moderate bilateral facet degenerative change. Minimal disc bulge. No spinal canal narrowing. Mild bilateral neural foraminal narrowing.   L1-L2: Mild bilateral facet degenerative change. Minimal disc bulge. No spinal canal narrowing. Mild bilateral neural foraminal narrowing.   L2-L3: Moderate bilateral facet degenerative change, left-greater-than-right. Minimal disc bulge. Mild left and moderate to severe right neural foraminal narrowing. No spinal canal narrowing.   L3-L4: Eccentric left disc bulge. Severe left and mild right facet degenerative change. There is narrowing of the left lateral recess. Mild right and severe left neural foraminal narrowing. Mild overall spinal canal narrowing.   L4-L5: Severe bilateral facet degenerative change, left-greater-than-right. Eccentric left disc bulge. Moderate spinal canal narrowing. Severe left and mild right neural foraminal narrowing.   L5-S1: Severe bilateral facet degenerative change. Minimal disc bulge. No significant spinal canal narrowing. Mild-to-moderate bilateral neural foraminal narrowing.   IMPRESSION: 1. Multilevel degenerative changes of the lumbar spine, most pronounced at L4-L5 where there is moderate spinal canal narrowing and severe left neural foraminal narrowing. 2. Severe left neural foraminal narrowing at L3-L4. 3. Moderate to severe right neural foraminal narrowing at L2-L3. Note: Reviewed        Physical Exam  General appearance: Well nourished, well developed, and well hydrated. In no apparent acute distress Mental status: Alert, oriented x 3 (person, place, & time)       Respiratory: No evidence of acute  respiratory  distress Eyes: PERLA Vitals: BP (!) 141/83   Pulse 81   Temp (!) 97.3 F (36.3 C) (Temporal)   Resp 18   Ht 5' (1.524 m)   Wt 165 lb (74.8 kg)   SpO2 96%   BMI 32.22 kg/m  BMI: Estimated body mass index is 32.22 kg/m as calculated from the following:   Height as of this encounter: 5' (1.524 m).   Weight as of this encounter: 165 lb (74.8 kg). Ideal: Ideal body weight: 45.5 kg (100 lb 4.9 oz) Adjusted ideal body weight: 57.2 kg (126 lb 3 oz)  Lumbar Spine Area Exam  Skin & Axial Inspection: No masses, redness, or swelling Alignment: Symmetrical Functional ROM: Pain restricted ROM affecting primarily the right Stability: No instability detected Muscle Tone/Strength: Functionally intact. No obvious neuro-muscular anomalies detected. Sensory (Neurological): Dermatomal pain pattern Palpation: No palpable anomalies       Provocative Tests: Hyperextension/rotation test: (+) due to pain. Lumbar quadrant test (Kemp's test): (+) on the right for foraminal stenosis  Gait & Posture Assessment  Ambulation: Limited Gait: Antalgic gait (limping) Posture: Difficulty standing up straight, due to pain  Lower Extremity Exam    Side: Right lower extremity  Side: Left lower extremity  Stability: No instability observed          Stability: No instability observed          Skin & Extremity Inspection: Skin color, temperature, and hair growth are WNL. No peripheral edema or cyanosis. No masses, redness, swelling, asymmetry, or associated skin lesions. No contractures.  Skin & Extremity Inspection: Skin color, temperature, and hair growth are WNL. No peripheral edema or cyanosis. No masses, redness, swelling, asymmetry, or associated skin lesions. No contractures.  Functional ROM: Pain restricted ROM for hip and knee joints          Functional ROM: Unrestricted ROM                  Muscle Tone/Strength: Functionally intact. No obvious neuro-muscular anomalies detected.  Muscle Tone/Strength:  Functionally intact. No obvious neuro-muscular anomalies detected.  Sensory (Neurological): Unimpaired        Sensory (Neurological): Unimpaired        DTR: Patellar: deferred today Achilles: deferred today Plantar: deferred today  DTR: Patellar: deferred today Achilles: deferred today Plantar: deferred today  Palpation: No palpable anomalies  Palpation: No palpable anomalies    Assessment   Diagnosis Status  1. Spinal stenosis, lumbar region, with neurogenic claudication   2. Chronic radicular lumbar pain   3. Degeneration of intervertebral disc of lumbar region with discogenic back pain   4. Chronic pain syndrome    Having a Flare-up Having a Flare-up Controlled   Updated Problems: Problem  Chronic Radicular Lumbar Pain    Plan of Care  Unfortunately, the patient did not have a positive response to her diagnostic lumbar facet medial branch nerve blocks.  Her lumbar spine pain is multifactorial.  She has multilevel spinal canal stenosis and foraminal stenosis.  She experiences symptoms of neurogenic claudication.  We have discussed an epidural steroid injection in the past.  She has neuroforaminal stenosis right greater than left bilaterally at L3 and L4.  Risks and benefits of transforaminal ESI reviewed and patient would like to proceed.  She is also requesting tramadol prescription that was previously prescribed by her primary care provider.  She states that she would take 50 mg once to twice daily as needed for pain.  I will have her  complete a baseline urine toxicology screen, she will sign controlled substance agreement and I will prescribe her tramadol as below.    Pharmacotherapy (Medications Ordered): Meds ordered this encounter  Medications   DISCONTD: traMADol (ULTRAM) 50 MG tablet    Sig: Take 1 tablet (50 mg total) by mouth every 12 (twelve) hours as needed for severe pain.    Dispense:  60 tablet    Refill:  1    Fill one day early if pharmacy is closed on  scheduled refill date. Do not fill until:  To last until:   traMADol (ULTRAM) 50 MG tablet    Sig: Take 1 tablet (50 mg total) by mouth every 12 (twelve) hours as needed for severe pain.    Dispense:  60 tablet    Refill:  1    Fill one day early if pharmacy is closed on scheduled refill date.   Orders:  Orders Placed This Encounter  Procedures   Lumbar Transforaminal Epidural    Standing Status:   Future    Standing Expiration Date:   03/17/2023    Scheduling Instructions:     Laterality: Bilateral     Level(s): L3 and L4     Sedation: without     Timeframe: ASAP    Order Specific Question:   Where will this procedure be performed?    Answer:   [facility]   Compliance Drug Analysis, Ur    Volume: 30 ml(s). Minimum 3 ml of urine is needed. Document temperature of fresh sample. Indications: Long term (current) use of opiate analgesic (Z61.096) Test#: [phone] (Comprehensive Profile)    Order Specific Question:   Release to patient    Answer:   Immediate   Follow-up plan:   Return in about 12 days (around 12/27/2022) for B/L L3 & L4 TF ESi, in clinic NS.      BLF L3-5 11/17/22     Recent Visits Date Type Provider Dept  11/17/22 Procedure visit Edward Jolly, MD [facility]  10/12/22 Office Visit Edward Jolly, MD [facility]  Showing recent visits within past 90 days and meeting all other requirements Today's Visits Date Type Provider Dept  12/15/22 Office Visit Edward Jolly, MD [facility]  Showing today's visits and meeting all other requirements Future Appointments No visits were found meeting these conditions. Showing future appointments within next 90 days and meeting all other requirements  I discussed the assessment and treatment plan with the patient. The patient was provided an opportunity to ask questions and all were answered. The patient agreed with the plan and demonstrated an understanding of the instructions.  Patient  advised to call back or seek an in-person evaluation if the symptoms or condition worsens.  Duration of encounter: 30 minutes.  Total time on encounter, as per AMA guidelines included both the face-to-face and non-face-to-face time personally spent by the physician and/or other qualified health care professional(s) on the day of the encounter (includes time in activities that require the physician or other qualified health care professional and does not include time in activities normally performed by clinical staff). Physician's time may include the following activities when performed: Preparing to see the patient (e.g., pre-charting review of records, searching for previously ordered imaging, lab work, and nerve conduction tests) Review of prior analgesic pharmacotherapies. Reviewing PMP Interpreting ordered tests (e.g., lab work, imaging, nerve conduction tests) Performing post-procedure evaluations, including interpretation of diagnostic procedures Obtaining and/or reviewing separately obtained history Performing a medically appropriate  examination and/or evaluation Counseling and educating the patient/family/caregiver Ordering medications, tests, or procedures Referring and communicating with other health care professionals (when not separately reported) Documenting clinical information in the electronic or other health record Independently interpreting results (not separately reported) and communicating results to the patient/ family/caregiver Care coordination (not separately reported)  Note by: Edward Jolly, MD Date: 12/15/2022; Time: 3:25 PM

## 2022-12-15 NOTE — Patient Instructions (Addendum)

## 2022-12-19 LAB — COMPLIANCE DRUG ANALYSIS, UR

## 2023-01-03 ENCOUNTER — Ambulatory Visit: Payer: Medicare Other | Admitting: Student in an Organized Health Care Education/Training Program

## 2023-01-05 ENCOUNTER — Ambulatory Visit
Payer: Medicare Other | Attending: Student in an Organized Health Care Education/Training Program | Admitting: Student in an Organized Health Care Education/Training Program

## 2023-01-05 ENCOUNTER — Encounter: Payer: Self-pay | Admitting: Student in an Organized Health Care Education/Training Program

## 2023-01-05 ENCOUNTER — Ambulatory Visit
Admission: RE | Admit: 2023-01-05 | Discharge: 2023-01-05 | Disposition: A | Discharge status: Home / Self Care | Payer: Medicare Other | Source: Ambulatory Visit | Attending: Student in an Organized Health Care Education/Training Program | Admitting: Student in an Organized Health Care Education/Training Program

## 2023-01-05 DIAGNOSIS — G8929 Other chronic pain: Secondary | ICD-10-CM | POA: Diagnosis present

## 2023-01-05 DIAGNOSIS — M48062 Spinal stenosis, lumbar region with neurogenic claudication: Secondary | ICD-10-CM | POA: Diagnosis present

## 2023-01-05 DIAGNOSIS — M5416 Radiculopathy, lumbar region: Secondary | ICD-10-CM | POA: Insufficient documentation

## 2023-01-05 MED ORDER — DEXAMETHASONE SODIUM PHOSPHATE 10 MG/ML IJ SOLN
20.0000 mg | Freq: Once | INTRAMUSCULAR | Status: AC
  Administered 2023-01-05: 20 mg
  Filled 2023-01-05: qty 2

## 2023-01-05 MED ORDER — SODIUM CHLORIDE 0.9% FLUSH: 2.0000 mL | Freq: Once | INTRAVENOUS | Status: DC

## 2023-01-05 MED ORDER — LIDOCAINE HCL 2 % IJ SOLN
20.0000 mL | Freq: Once | INTRAMUSCULAR | Status: AC
  Administered 2023-01-05: 400 mg
  Filled 2023-01-05: qty 40

## 2023-01-05 MED ORDER — ROPIVACAINE HCL 2 MG/ML IJ SOLN
2.0000 mL | Freq: Once | INTRAMUSCULAR | Status: AC
  Administered 2023-01-05: 2 mL via EPIDURAL
  Filled 2023-01-05: qty 20

## 2023-01-05 MED ORDER — IOHEXOL 180 MG/ML  SOLN
10.0000 mL | Freq: Once | INTRAMUSCULAR | Status: AC
  Administered 2023-01-05: 10 mL via EPIDURAL
  Filled 2023-01-05: qty 20

## 2023-01-05 NOTE — Progress Notes (Signed)
Safety precautions to be maintained throughout the outpatient stay will include: orient to surroundings, keep bed in low position, maintain call bell within reach at all times, provide assistance with transfer out of bed and ambulation.  

## 2023-01-05 NOTE — Progress Notes (Signed)
PROVIDER NOTE: Interpretation of information contained herein should be left to medically-trained personnel. Specific patient instructions are provided elsewhere under "Patient Instructions" section of medical record. This document was created in part using STT-dictation technology, any transcriptional errors that may result from this process are unintentional.  Patient: Andrea Blake Type: Established DOB: March 27, 1931 MRN: [id number] PCP: [facility] [place] Primary  Service: Procedure DOS: 01/05/2023 Setting: Ambulatory Location: Ambulatory outpatient facility Delivery: Face-to-face Provider: Edward Jolly, MD Specialty: Interventional Pain Management Specialty designation: 09 Location: Outpatient facility Ref. Prov.: Edward Jolly, MD       Interventional Therapy   Procedure: Lumbar trans-foraminal epidural steroid injection (L-TFESI) #1  Laterality: Bilateral (-50)  Level: L3 and L4 nerve root(s) Imaging: Fluoroscopy-guided         Anesthesia: Local anesthesia (1-2% Lidocaine) Sedation: No Sedation                       DOS: 01/05/2023  Performed by: Edward Jolly, MD  Purpose: Diagnostic/Therapeutic Indications: Lumbar radicular pain severe enough to impact quality of life or function. 1. Spinal stenosis, lumbar region, with neurogenic claudication   2. Chronic radicular lumbar pain    NAS-11 Pain score:   Pre-procedure: 3 /10   Post-procedure: 3 /10     Position / Prep / Materials:  Position: Prone  Prep solution: ChloraPrep (2% chlorhexidine gluconate and 70% isopropyl alcohol) Prep Area: Entire Posterior Lumbosacral Area.  From the lower tip of the scapula down to the tailbone and from flank to flank. Materials:  Tray: Block Needle(s):  Type: Spinal  Gauge (G): 22  Length: 5-in  Qty: 2      H&P (Pre-op Assessment):  Andrea Blake is a 89 y.o. (year old), female patient, seen today for interventional treatment. She  has a past surgical history that includes Joint  replacement; Colon surgery; Hernia repair; Cholecystectomy; Cataract extraction (Bilateral); Rotator cuff repair (Right); and Excision Morton's neuroma (Left). Andrea Blake has a current medication list which includes the following prescription(s): acetaminophen, alendronate, amlodipine, bisacodyl, oyster shell calcium/d, diphenhydramine, duloxetine, hydrochlorothiazide, levothyroxine, loratadine, losartan, lubiprostone, magnesium oxide, melatonin, omega-3 fish oil, omeprazole, tramadol, wheat dextrin, and famotidine, and the following Facility-Administered Medications: sodium chloride flush. Her primarily concern today is the Back Pain  Initial Vital Signs:  Pulse/HCG Rate: 66ECG Heart Rate: 83 Temp: (!) 97.3 F (36.3 C) Resp: 16 BP: (!) 153/71 SpO2: 95 %  BMI: Estimated body mass index is 33.2 kg/m as calculated from the following:   Height as of this encounter: 5' (1.524 m).   Weight as of this encounter: 170 lb (77.1 kg).  Risk Assessment: Allergies: Reviewed. She is allergic to other, shellfish allergy, sulfa antibiotics, sulfasalazine, amitriptyline, flagyl [metronidazole], gabapentin, penicillins, ciprofloxacin, and levofloxacin.  Allergy Precautions: None required Coagulopathies: Reviewed. None identified.  Blood-thinner therapy: None at this time Active Infection(s): Reviewed. None identified. Andrea Blake is afebrile  Site Confirmation: Andrea Blake was asked to confirm the procedure and laterality before marking the site Procedure checklist: Completed Consent: Before the procedure and under the influence of no sedative(s), amnesic(s), or anxiolytics, the patient was informed of the treatment options, risks and possible complications. To fulfill our ethical and legal obligations, as recommended by the American Medical Association's Code of Ethics, I have informed the patient of my clinical impression; the nature and purpose of the treatment or procedure; the risks, benefits, and  possible complications of the intervention; the alternatives, including doing nothing; the risk(s) and benefit(s) of the alternative treatment(s)  or procedure(s); and the risk(s) and benefit(s) of doing nothing. The patient was provided information about the general risks and possible complications associated with the procedure. These may include, but are not limited to: failure to achieve desired goals, infection, bleeding, organ or nerve damage, allergic reactions, paralysis, and death. In addition, the patient was informed of those risks and complications associated to Spine-related procedures, such as failure to decrease pain; infection (i.e.: Meningitis, epidural or intraspinal abscess); bleeding (i.e.: epidural hematoma, subarachnoid hemorrhage, or any other type of intraspinal or peri-dural bleeding); organ or nerve damage (i.e.: Any type of peripheral nerve, nerve root, or spinal cord injury) with subsequent damage to sensory, motor, and/or autonomic systems, resulting in permanent pain, numbness, and/or weakness of one or several areas of the body; allergic reactions; (i.e.: anaphylactic reaction); and/or death. Furthermore, the patient was informed of those risks and complications associated with the medications. These include, but are not limited to: allergic reactions (i.e.: anaphylactic or anaphylactoid reaction(s)); adrenal axis suppression; blood sugar elevation that in diabetics may result in ketoacidosis or comma; water retention that in patients with history of congestive heart failure may result in shortness of breath, pulmonary edema, and decompensation with resultant heart failure; weight gain; swelling or edema; medication-induced neural toxicity; particulate matter embolism and blood vessel occlusion with resultant organ, and/or nervous system infarction; and/or aseptic necrosis of one or more joints. Finally, the patient was informed that Medicine is not an exact science; therefore, there  is also the possibility of unforeseen or unpredictable risks and/or possible complications that may result in a catastrophic outcome. The patient indicated having understood very clearly. We have given the patient no guarantees and we have made no promises. Enough time was given to the patient to ask questions, all of which were answered to the patient's satisfaction. Andrea Blake has indicated that she wanted to continue with the procedure. Attestation: I, the ordering provider, attest that I have discussed with the patient the benefits, risks, side-effects, alternatives, likelihood of achieving goals, and potential problems during recovery for the procedure that I have provided informed consent. Date  Time: 01/05/2023  9:02 AM   Pre-Procedure Preparation:  Monitoring: As per clinic protocol. Respiration, ETCO2, SpO2, BP, heart rate and rhythm monitor placed and checked for adequate function Safety Precautions: Patient was assessed for positional comfort and pressure points before starting the procedure. Time-out: I initiated and conducted the "Time-out" before starting the procedure, as per protocol. The patient was asked to participate by confirming the accuracy of the "Time Out" information. Verification of the correct person, site, and procedure were performed and confirmed by me, the nursing staff, and the patient. "Time-out" conducted as per Joint Commission's Universal Protocol (UP.01.01.01). Time: 1016 Start Time: 1016 hrs.  Description/Narrative of Procedure:          Target: The 6 o'clock position under the pedicle, on the affected side. Region: Posterolateral Lumbosacral Approach: Posterior Percutaneous Paravertebral approach.  Rationale (medical necessity): procedure needed and proper for the diagnosis and/or treatment of the patient's medical symptoms and needs. Procedural Technique Safety Precautions: Aspiration looking for blood return was conducted prior to all injections. At no  point did we inject any substances, as a needle was being advanced. No attempts were made at seeking any paresthesias. Safe injection practices and needle disposal techniques used. Medications properly checked for expiration dates. SDV (single dose vial) medications used. Description of the Procedure: Protocol guidelines were followed. The patient was placed in position over the procedure table. The  target area was identified and the area prepped in the usual manner. Skin & deeper tissues infiltrated with local anesthetic. Appropriate amount of time allowed to pass for local anesthetics to take effect. The procedure needles were then advanced to the target area. Proper needle placement secured. Negative aspiration confirmed. Solution injected in intermittent fashion, asking for systemic symptoms every 0.5cc of injectate. The needles were then removed and the area cleansed, making sure to leave some of the prepping solution back to take advantage of its long term bactericidal properties.  10 cc solution made of 5 cc of preservative-free saline, 3cc of 0.2% ropivacaine, 2 cc of Decadron 10 mg/cc. 2.5cc injected for the left and right L3 nerve 2.5cc injected for the left and right L4 nerve   Vitals:   01/05/23 0908 01/05/23 1015 01/05/23 1020 01/05/23 1025  BP: (!) 153/71 (!) 185/79 (!) 158/72 (!) 156/80  Pulse: 66     Resp: 16 18 16 17   Temp: (!) 97.3 F (36.3 C)     SpO2: 95% 99% 98% 98%  Weight: 170 lb (77.1 kg)     Height: 5' (1.524 m)       Start Time: 1016 hrs. End Time: 1029 hrs.  Imaging Guidance (Spinal):          Type of Imaging Technique: Fluoroscopy Guidance (Spinal) Indication(s): Assistance in needle guidance and placement for procedures requiring needle placement in or near specific anatomical locations not easily accessible without such assistance. Exposure Time: Please see nurses notes. Contrast: Before injecting any contrast, we confirmed that the patient did not have an  allergy to iodine, shellfish, or radiological contrast. Once satisfactory needle placement was completed at the desired level, radiological contrast was injected. Contrast injected under live fluoroscopy. No contrast complications. See chart for type and volume of contrast used. Fluoroscopic Guidance: I was personally present during the use of fluoroscopy. "Tunnel Vision Technique" used to obtain the best possible view of the target area. Parallax error corrected before commencing the procedure. "Direction-depth-direction" technique used to introduce the needle under continuous pulsed fluoroscopy. Once target was reached, antero-posterior, oblique, and lateral fluoroscopic projection used confirm needle placement in all planes. Images permanently stored in EMR. Interpretation: I personally interpreted the imaging intraoperatively. Adequate needle placement confirmed in multiple planes. Appropriate spread of contrast into desired area was observed. No evidence of afferent or efferent intravascular uptake. No intrathecal or subarachnoid spread observed. Permanent images saved into the patient's record.  Post-operative Assessment:  Post-procedure Vital Signs:  Pulse/HCG Rate: 6680 Temp: (!) 97.3 F (36.3 C) Resp: 17 BP: (!) 156/80 SpO2: 98 %  EBL: None  Complications: No immediate post-treatment complications observed by team, or reported by patient.  Note: The patient tolerated the entire procedure well. A repeat set of vitals were taken after the procedure and the patient was kept under observation following institutional policy, for this type of procedure. Post-procedural neurological assessment was performed, showing return to baseline, prior to discharge. The patient was provided with post-procedure discharge instructions, including a section on how to identify potential problems. Should any problems arise concerning this procedure, the patient was given instructions to immediately contact us, at  any time, without hesitation. In any case, we plan to contact the patient by telephone for a follow-up status report regarding this interventional procedure.  Comments:  No additional relevant information.  Plan of Care (POC)  Orders:  Orders Placed This Encounter  Procedures   DG PAIN CLINIC C-ARM 1-60 MIN NO REPORT  Intraoperative interpretation by procedural physician at [facility].    Standing Status:   Standing    Number of Occurrences:   1    Order Specific Question:   Reason for exam:    Answer:   Assistance in needle guidance and placement for procedures requiring needle placement in or near specific anatomical locations not easily accessible without such assistance.   Medications ordered for procedure: Meds ordered this encounter  Medications   iohexol (OMNIPAQUE) 180 MG/ML injection 10 mL    Must be Myelogram-compatible. If not available, you may substitute with a water-soluble, non-ionic, hypoallergenic, myelogram-compatible radiological contrast medium.   lidocaine (XYLOCAINE) 2 % (with pres) injection 400 mg   sodium chloride flush (NS) 0.9 % injection 2 mL    This is for a two (2) level block. Use two (2) syringes and divide content in half.   ropivacaine (PF) 2 mg/mL (0.2%) (NAROPIN) injection 2 mL    This is for a two (2) level block. Use two (2) syringes and divide content in half.   dexamethasone (DECADRON) injection 20 mg    This is for a two (2) level block. Use two (2) syringes and divide content in half.   Medications administered: We administered iohexol, lidocaine, ropivacaine (PF) 2 mg/mL (0.2%), and dexamethasone.  See the medical record for exact dosing, route, and time of administration.  Follow-up plan:   Return in about 6 weeks (around 02/16/2023) for PPE IN PERSON.       BLF L3-5 11/17/22, B/L L3 and L4 TF ESI      Recent Visits Date Type Provider Dept  12/15/22 Office Visit Edward Jolly, MD [facility]  11/17/22  Procedure visit Edward Jolly, MD [facility]  10/12/22 Office Visit Edward Jolly, MD [facility]  Showing recent visits within past 90 days and meeting all other requirements Today's Visits Date Type Provider Dept  01/05/23 Procedure visit Edward Jolly, MD [facility]  Showing today's visits and meeting all other requirements Future Appointments Date Type Provider Dept  02/16/23 Appointment Edward Jolly, MD [facility]  Showing future appointments within next 90 days and meeting all other requirements  Disposition: Discharge home  Discharge (Date  Time): 01/05/2023; 1040 hrs.   Primary Care Physician: [facility] [place] Primary Location: [facility] Note by: Edward Jolly, MD (TTS technology used. I apologize for any typographical errors that were not detected and corrected.) Date: 01/05/2023; Time: 11:48 AM  Disclaimer:  Medicine is not an Visual merchandiser. The only guarantee in medicine is that nothing is guaranteed. It is important to note that the decision to proceed with this intervention was based on the information collected from the patient. The Data and conclusions were drawn from the patient's questionnaire, the interview, and the physical examination. Because the information was provided in large part by the patient, it cannot be guaranteed that it has not been purposely or unconsciously manipulated. Every effort has been made to obtain as much relevant data as possible for this evaluation. It is important to note that the conclusions that lead to this procedure are derived in large part from the available data. Always take into account that the treatment will also be dependent on availability of resources and existing treatment guidelines, considered by other Pain Management Practitioners as being common knowledge and practice, at the time of the intervention. For Medico-Legal purposes, it is also important to point out  that variation in procedural techniques and pharmacological  choices are the acceptable norm. The indications, contraindications, technique, and results of the above procedure should only be interpreted and judged by a Board-Certified Interventional Pain Specialist with extensive familiarity and expertise in the same exact procedure and technique.

## 2023-01-05 NOTE — Patient Instructions (Signed)
Pain Management Discharge Instructions  General Discharge Instructions :  If you need to reach your doctor call: Monday-Friday 8:00 am - 4:00 pm at [phone] or toll free [phone].  After clinic hours [phone] to have operator reach doctor.  Bring all of your medication bottles to all your appointments in the pain clinic.  To cancel or reschedule your appointment with Pain Management please remember to call 24 hours in advance to avoid a fee.  Refer to the educational materials which you have been given on: General Risks, I had my Procedure. Discharge Instructions, Post Sedation.  Post Procedure Instructions:  The drugs you were given will stay in your system until tomorrow, so for the next 24 hours you should not drive, make any legal decisions or drink any alcoholic beverages.  You may eat anything you prefer, but it is better to start with liquids then soups and crackers, and gradually work up to solid foods.  Please notify your doctor immediately if you have any unusual bleeding, trouble breathing or pain that is not related to your normal pain.  Depending on the type of procedure that was done, some parts of your body may feel week and/or numb.  This usually clears up by tonight or the next day.  Walk with the use of an assistive device or accompanied by an adult for the 24 hours.  You may use ice on the affected area for the first 24 hours.  Put ice in a Ziploc bag and cover with a towel and place against area 15 minutes on 15 minutes off.  You may switch to heat after 24 hours.Selective Nerve Root Block Patient Information  Description: Specific nerve roots exit the spinal canal and these nerves can be compressed and inflamed by a bulging disc and bone spurs.  By injecting steroids on the nerve root, we can potentially decrease the inflammation surrounding these nerves, which often leads to decreased pain.  Also, by injecting local anesthesia on the nerve root, this can  provide us helpful information to give to your referring doctor if it decreases your pain.  Selective nerve root blocks can be done along the spine from the neck to the low back depending on the location of your pain.   After numbing the skin with local anesthesia, a small needle is passed to the nerve root and the position of the needle is verified using x-ray pictures.  After the needle is in correct position, we then deposit the medication.  You may experience a pressure sensation while this is being done.  The entire block usually lasts less than 15 minutes.  Conditions that may be treated with selective nerve root blocks: Low back and leg pain Spinal stenosis Diagnostic block prior to potential surgery Neck and arm pain Post laminectomy syndrome  Preparation for the injection:  Do not eat any solid food or dairy products within 8 hours of your appointment. You may drink clear liquids up to 3 hours before an appointment.  Clear liquids include water, black coffee, juice or soda.  No milk or cream please. You may take your regular medications, including pain medications, with a sip of water before your appointment.  Diabetics should hold regular insulin (if taken separately) and take 1/2 normal NPH dose the morning of the procedure.  Carry some sugar containing items with you to your appointment. A driver must accompany you and be prepared to drive you home after your procedure. Bring all your current medications with you. An IV   may be inserted and sedation may be given at the discretion of the physician. A blood pressure cuff, EKG, and other monitors will often be applied during the procedure.  Some patients may need to have extra oxygen administered for a short period. You will be asked to provide medical information, including allergies, prior to the procedure.  We must know immediately if you are taking blood  Thinners (like Coumadin) or if you are allergic to IV iodine contrast  (dye).  Possible side-effects: All are usually temporary Bleeding from needle site Light headedness Numbness and tingling Decreased blood pressure Weakness in arms/legs Pressure sensation in back/neck Pain at injection site (several days)  Possible complications: All are extremely rare Infection Nerve injury Spinal headache (a headache wore with upright position)  Call if you experience: Fever/chills associated with headache or increased back/neck pain Headache worsened by an upright position New onset weakness or numbness of an extremity below the injection site Hives or difficulty breathing (go to the emergency room) Inflammation or drainage at the injection site(s) Severe back/neck pain greater than usual New symptoms which are concerning to you  Please note:  Although the local anesthetic injected can often make your back or neck feel good for several hours after the injection the pain will likely return.  It takes 3-5 days for steroids to work on the nerve root. You may not notice any pain relief for at least one week.  If effective, we will often do a series of 3 injections spaced 3-6 weeks apart to maximally decrease your pain.    If you have any questions, please call [phone]  [facility] 

## 2023-01-06 ENCOUNTER — Telehealth: Payer: Self-pay

## 2023-01-06 NOTE — Telephone Encounter (Signed)
Post procedure follow up. Patient states she is doing fine today. She had a bad headache last night but it has subsided.

## 2023-02-16 ENCOUNTER — Ambulatory Visit: Payer: Medicare Other | Admitting: Student in an Organized Health Care Education/Training Program

## 2023-02-23 ENCOUNTER — Other Ambulatory Visit: Payer: Self-pay | Admitting: Internal Medicine

## 2023-02-23 DIAGNOSIS — Z1231 Encounter for screening mammogram for malignant neoplasm of breast: Secondary | ICD-10-CM

## 2023-03-07 ENCOUNTER — Encounter: Payer: Self-pay | Admitting: Student in an Organized Health Care Education/Training Program

## 2023-03-07 ENCOUNTER — Ambulatory Visit
Payer: Medicare Other | Attending: Student in an Organized Health Care Education/Training Program | Admitting: Student in an Organized Health Care Education/Training Program

## 2023-03-07 VITALS — BP 152/71 | HR 79 | Temp 97.9°F | Resp 16 | Ht 60.0 in | Wt 170.0 lb

## 2023-03-07 DIAGNOSIS — M47816 Spondylosis without myelopathy or radiculopathy, lumbar region: Secondary | ICD-10-CM | POA: Diagnosis not present

## 2023-03-07 DIAGNOSIS — G894 Chronic pain syndrome: Secondary | ICD-10-CM | POA: Insufficient documentation

## 2023-03-07 DIAGNOSIS — M5136 Other intervertebral disc degeneration, lumbar region with discogenic back pain only: Secondary | ICD-10-CM | POA: Insufficient documentation

## 2023-03-07 MED ORDER — TRAMADOL HCL 50 MG PO TABS: 50.0000 mg | ORAL_TABLET | Freq: Two times a day (BID) | ORAL | 2 refills | Status: DC | PRN

## 2023-03-07 NOTE — Patient Instructions (Signed)

## 2023-03-07 NOTE — Progress Notes (Signed)
PROVIDER NOTE: Information contained herein reflects review and annotations entered in association with encounter. Interpretation of such information and data should be left to medically-trained personnel. Information provided to patient can be located elsewhere in the medical record under "Patient Instructions". Document created using STT-dictation technology, any transcriptional errors that may result from process are unintentional.    Patient: Andrea Blake  Service Category: E/M  Provider: Edward Jolly, MD  DOB: 07/05/1931  DOS: 03/07/2023  Referring Provider: [facility]  MRN: [id number]  Specialty: Interventional Pain Management  PCP: [facility]  Type: Established Patient  Setting: Ambulatory outpatient    Location: Office  Delivery: Face-to-face     HPI  [name], a 89 y.o. year old female, is here today because of her Lumbar facet arthropathy [M47.816]. [name] primary complain today is Back Pain (lower)  Pertinent problems: [name] has Lumbar facet arthropathy; Spinal stenosis, lumbar region, with neurogenic claudication; Lumbar degenerative disc disease; Chronic pain syndrome; and Chronic radicular lumbar pain on their pertinent problem list. Pain Assessment: Severity of Chronic pain is reported as a 3 /10. Location: Back Lower/outside of left leg to the knee. Onset: 1 to 4 weeks ago. Quality: Sharp, Stabbing. Timing: Intermittent. Modifying factor(s): sitting, stillness. Vitals:  height is 5' (1.524 m) and weight is 170 lb (77.1 kg). Her temporal temperature is 97.9 F (36.6 C). Her blood pressure is 152/71 (abnormal) and her pulse is 79. Her respiration is 16 and oxygen saturation is 98%.  BMI: Estimated body mass index is 33.2 kg/m as calculated from the following:   Height as of this encounter: 5' (1.524 m).   Weight as of this encounter: 170 lb (77.1 kg). Last encounter: 12/15/2022. Last procedure: 01/05/2023.  Reason for  encounter: both, medication management and post-procedure evaluation and assessment.    Post-procedure evaluation    Procedure: Lumbar trans-foraminal epidural steroid injection (L-TFESI) #1  Laterality: Bilateral (-50)  Level: L3 and L4 nerve root(s) Imaging: Fluoroscopy-guided         Anesthesia: Local anesthesia (1-2% Lidocaine) Sedation: No Sedation                       DOS: 01/05/2023  Performed by: Edward Jolly, MD  Purpose: Diagnostic/Therapeutic Indications: Lumbar radicular pain severe enough to impact quality of life or function. 1. Spinal stenosis, lumbar region, with neurogenic claudication   2. Chronic radicular lumbar pain    NAS-11 Pain score:   Pre-procedure: 3 /10   Post-procedure: 3 /10     Effectiveness:  Initial hour after procedure:  (does not remember)  Subsequent 4-6 hours post-procedure:  (does not remember)  Analgesia past initial 6 hours: 30 % - 40% Ongoing improvement:  Analgesic:  40% Function: Somewhat improved   Pharmacotherapy Assessment  Analgesic: Tramadol 50 mg BID prn   Monitoring: [place] PMP: PDMP reviewed during this encounter.       Pharmacotherapy: No side-effects or adverse reactions reported. Compliance: No problems identified. Effectiveness: Clinically acceptable.  Andrea Elk, RN  03/07/2023  1:42 PM  Sign when Signing Visit Nursing Pain Medication Assessment:  Safety precautions to be maintained throughout the outpatient stay will include: orient to surroundings, keep bed in low position, maintain call bell within reach at all times, provide assistance with transfer out of bed and ambulation.  Medication Inspection Compliance: [name] did not comply with our request to bring her pills to be counted. She was reminded that bringing the medication  bottles, even when empty, is a requirement.  Medication: None brought in. Pill/Patch Count: None available to be counted. Bottle Appearance: No container available. Did not  bring bottle(s) to appointment. Filled Date: N/A Last Medication intake:  Today Safety precautions to be maintained throughout the outpatient stay will include: orient to surroundings, keep bed in low position, maintain call bell within reach at all times, provide assistance with transfer out of bed and ambulation.     No results found for: "CBDTHCR" No results found for: "D8THCCBX" No results found for: "D9THCCBX"  UDS:  Summary  Date Value Ref Range Status  12/15/2022 Note  Final    Comment:    ==================================================================== Compliance Drug Analysis, Ur ==================================================================== Test                             Result       Flag       Units  Drug Present and Declared for Prescription Verification   Tramadol                       >3521        EXPECTED   ng/mg creat   O-Desmethyltramadol            2561         EXPECTED   ng/mg creat   N-Desmethyltramadol            1020         EXPECTED   ng/mg creat    Source of tramadol is a prescription medication. O-desmethyltramadol    and N-desmethyltramadol are expected metabolites of tramadol.    Duloxetine                     PRESENT      EXPECTED   Acetaminophen                  PRESENT      EXPECTED ==================================================================== Test                      Result    Flag   Units      Ref Range   Creatinine              142              mg/dL      >=16 ==================================================================== Declared Medications:  The flagging and interpretation on this report are based on the  following declared medications.  Unexpected results may arise from  inaccuracies in the declared medications.   **Note: The testing scope of this panel includes these medications:   Duloxetine (Cymbalta)  Tramadol (Ultram)   **Note: The testing scope of this panel does not include small to  moderate amounts of  these reported medications:   Acetaminophen (Tylenol)   **Note: The testing scope of this panel does not include the  following reported medications:   Amlodipine (Norvasc)  Bisacodyl (Dulcolax)  Calcium  Famotidine (Pepcid)  Hydrochlorothiazide (Hydrodiuril)  Levothyroxine (Synthroid)  Losartan (Cozaar)  Lubiprostone (Amitiza)  Magnesium (Mag-Ox)  Melatonin  Omega-3 Fatty Acids  Omeprazole (Prilosec)  Supplement  Vitamin D ==================================================================== For clinical consultation, please call [phone]. ====================================================================       ROS  Constitutional: Denies any fever or chills Gastrointestinal: No reported hemesis, hematochezia, vomiting, or acute GI distress Musculoskeletal:  +low back pain Neurological: No reported episodes of acute  onset apraxia, aphasia, dysarthria, agnosia, amnesia, paralysis, loss of coordination, or loss of consciousness  Medication Review  DULoxetine, Melatonin, Oyster Shell Calcium/D, acetaminophen, alendronate, amLODipine, bisacodyl, diphenhydrAMINE, hydrochlorothiazide, levothyroxine, loratadine, losartan, magnesium oxide, omega-3 fish oil, omeprazole, polyethylene glycol, and traMADol  History Review  Allergy: [name] is allergic to other, sulfa antibiotics, sulfasalazine, amitriptyline, flagyl [metronidazole], gabapentin, penicillins, ciprofloxacin, and levofloxacin. Drug: [name]  reports no history of drug use. Alcohol:  reports no history of alcohol use. Tobacco:  reports that she has never smoked. She has never used smokeless tobacco. Social: [name]  reports that she has never smoked. She has never used smokeless tobacco. She reports that she does not drink alcohol and does not use drugs. Medical:  has a past medical history of Arthritis, Diverticulitis, GERD (gastroesophageal reflux disease), Hypertension, and Thyroid  disease. Surgical: [name]  has a past surgical history that includes Joint replacement; Colon surgery; Hernia repair; Cholecystectomy; Cataract extraction (Bilateral); Rotator cuff repair (Right); and Excision Morton's neuroma (Left). Family: family history is not on file.  Laboratory Chemistry Profile   Renal Lab Results  Component Value Date   BUN 29 (H) 11/24/2016   CREATININE 0.97 11/24/2016   GFRAA >60 11/24/2016   GFRNONAA 53 (L) 11/24/2016    Hepatic No results found for: "AST", "ALT", "ALBUMIN", "ALKPHOS", "HCVAB", "AMYLASE", "LIPASE", "AMMONIA"  Electrolytes Lab Results  Component Value Date   NA 139 11/24/2016   K 3.2 (L) 11/24/2016   CL 104 11/24/2016   CALCIUM 8.7 (L) 11/24/2016    Bone No results found for: "VD25OH", "VD125OH2TOT", "IH4742VZ5", "GL8756EP3", "25OHVITD1", "25OHVITD2", "25OHVITD3", "TESTOFREE", "TESTOSTERONE"  Inflammation (CRP: Acute Phase) (ESR: Chronic Phase) No results found for: "CRP", "ESRSEDRATE", "LATICACIDVEN"       Note: Above Lab results reviewed.  Recent Imaging Review  CLINICAL DATA:  Low back pain, symptoms persist with > 6 wks treatment Low back pain, spondyloarthropathy suspected, xray done   EXAM: MRI LUMBAR SPINE WITHOUT CONTRAST   TECHNIQUE: Multiplanar, multisequence MR imaging of the lumbar spine was performed. No intravenous contrast was administered.   COMPARISON:  None Available.   FINDINGS: Segmentation:  Standard.   Alignment: Leftward curvature of the lumbar spine centered at the L2 level. There is left lateral listhesis of L2 on L3 and L3 on L4. There is stepwise retrolisthesis of L1 on L2, L2 on L3, L3 on L4. There is grade 1 anterolisthesis of L4 on L5.   Vertebrae: No fracture, evidence of discitis, or bone lesion. Degenerative endplate changes most pronounced at L1 on L2.   Conus medullaris and cauda equina: Conus extends to the L2 level. Conus and cauda equina appear normal.   Paraspinal and  other soft tissues: Atrophy of the paraspinal musculature.   Disc levels:   T12-L1: Moderate bilateral facet degenerative change. Minimal disc bulge. No spinal canal narrowing. Mild bilateral neural foraminal narrowing.   L1-L2: Mild bilateral facet degenerative change. Minimal disc bulge. No spinal canal narrowing. Mild bilateral neural foraminal narrowing.   L2-L3: Moderate bilateral facet degenerative change, left-greater-than-right. Minimal disc bulge. Mild left and moderate to severe right neural foraminal narrowing. No spinal canal narrowing.   L3-L4: Eccentric left disc bulge. Severe left and mild right facet degenerative change. There is narrowing of the left lateral recess. Mild right and severe left neural foraminal narrowing. Mild overall spinal canal narrowing.   L4-L5: Severe bilateral facet degenerative change, left-greater-than-right. Eccentric left disc bulge. Moderate spinal canal narrowing. Severe left and mild right neural foraminal  narrowing.   L5-S1: Severe bilateral facet degenerative change. Minimal disc bulge. No significant spinal canal narrowing. Mild-to-moderate bilateral neural foraminal narrowing.   IMPRESSION: 1. Multilevel degenerative changes of the lumbar spine, most pronounced at L4-L5 where there is moderate spinal canal narrowing and severe left neural foraminal narrowing. 2. Severe left neural foraminal narrowing at L3-L4. 3. Moderate to severe right neural foraminal narrowing at L2-L3.     Electronically Signed   By: Lorenza Cambridge M.D.   On: 10/29/2022 07:52 Note: Reviewed        Physical Exam  General appearance: Well nourished, well developed, and well hydrated. In no apparent acute distress Mental status: Alert, oriented x 3 (person, place, & time)       Respiratory: No evidence of acute respiratory distress Eyes: PERLA Vitals: BP (!) 152/71   Pulse 79   Temp 97.9 F (36.6 C) (Temporal)   Resp 16   Ht 5' (1.524 m)   Wt  170 lb (77.1 kg)   SpO2 98%   BMI 33.20 kg/m  BMI: Estimated body mass index is 33.2 kg/m as calculated from the following:   Height as of this encounter: 5' (1.524 m).   Weight as of this encounter: 170 lb (77.1 kg). Ideal: Ideal body weight: 45.5 kg (100 lb 4.9 oz) Adjusted ideal body weight: 58.1 kg (128 lb 3 oz)  Lumbar Spine Area Exam  Skin & Axial Inspection: No masses, redness, or swelling Alignment: Symmetrical Functional ROM: Pain restricted ROM affecting both sides Stability: No instability detected Muscle Tone/Strength: Functionally intact. No obvious neuro-muscular anomalies detected. Sensory (Neurological): Musculoskeletal pain pattern Palpation: Complains of area being tender to palpation Bilateral Fist Percussion Test Provocative Tests: Hyperextension/rotation test: deferred today       Lumbar quadrant test (Kemp's test): (+) bilaterally for facet joint pain.  Gait & Posture Assessment  Ambulation: Unassisted Gait: Relatively normal for age and body habitus Posture: WNL  Lower Extremity Exam    Side: Right lower extremity  Side: Left lower extremity  Stability: No instability observed          Stability: No instability observed          Skin & Extremity Inspection: Skin color, temperature, and hair growth are WNL. No peripheral edema or cyanosis. No masses, redness, swelling, asymmetry, or associated skin lesions. No contractures.  Skin & Extremity Inspection: Skin color, temperature, and hair growth are WNL. No peripheral edema or cyanosis. No masses, redness, swelling, asymmetry, or associated skin lesions. No contractures.  Functional ROM: Unrestricted ROM                  Functional ROM: Unrestricted ROM                  Muscle Tone/Strength: Functionally intact. No obvious neuro-muscular anomalies detected.  Muscle Tone/Strength: Functionally intact. No obvious neuro-muscular anomalies detected.  Sensory (Neurological): Unimpaired        Sensory (Neurological):  Unimpaired        DTR: Patellar: deferred today Achilles: deferred today Plantar: deferred today  DTR: Patellar: deferred today Achilles: deferred today Plantar: deferred today  Palpation: No palpable anomalies  Palpation: No palpable anomalies    Assessment   Diagnosis Status  1. Lumbar facet arthropathy   2. Lumbar spondylosis   3. Degeneration of intervertebral disc of lumbar region with discogenic back pain   4. Chronic pain syndrome    Persistent Persistent Controlled   Updated Problems: Problem  Lumbar Facet Arthropathy  Plan of Care  Improvement in radiating leg pain after bilateral L3 and L4 transforaminal ESI.  She now struggles with more focal low back pain that is worse with lumbar extension and facet loading.  MRI of lumbar spine as above shows severe bilateral facet degenerative changes at L3-L4, L4-L5 and L5-S1.  Andrea Blake has a history of greater than 3 months of moderate to severe pain which is resulted in functional impairment.  The patient has tried various conservative therapeutic options such as NSAIDs, Tylenol, muscle relaxants, physical therapy which was inadequately effective.  Patient's pain is predominantly axial with physical exam and L-MRI findings suggestive of facet arthropathy.  Lumbar facet medial branch nerve blocks were discussed with the patient.  Risks and benefits were reviewed.  Patient would like to proceed with bilateral L3, L4, L5 medial branch nerve block.  Refill of tramadol as below.   Pharmacotherapy (Medications Ordered): Meds ordered this encounter  Medications   traMADol (ULTRAM) 50 MG tablet    Sig: Take 1 tablet (50 mg total) by mouth every 12 (twelve) hours as needed.    Dispense:  60 tablet    Refill:  2   Orders:  Orders Placed This Encounter  Procedures   LUMBAR FACET(MEDIAL BRANCH NERVE BLOCK) MBNB    Standing Status:   Future    Expiration Date:   06/05/2023    Scheduling Instructions:      Procedure: Lumbar facet block (AKA.: Lumbosacral medial branch nerve block)     Side: Bilateral     Level: L3-4, L4-5,  Facets ( L3, L4, L5, Medial Branch)     Sedation: without     Timeframe: ASAA    Where will this procedure be performed?:   [facility]   Follow-up plan:   Return in about 4 weeks (around 04/04/2023) for B/L L3, 4, 5 MBNB #1, in clinic NS.      BLF L3-5 11/17/22, B/L L3 and L4 TF ESI     Recent Visits Date Type Provider Dept  01/05/23 Procedure visit Edward Jolly, MD [facility]  12/15/22 Office Visit Edward Jolly, MD [facility]  Showing recent visits within past 90 days and meeting all other requirements Today's Visits Date Type Provider Dept  03/07/23 Office Visit Edward Jolly, MD [facility]  Showing today's visits and meeting all other requirements Future Appointments No visits were found meeting these conditions. Showing future appointments within next 90 days and meeting all other requirements  I discussed the assessment and treatment plan with the patient. The patient was provided an opportunity to ask questions and all were answered. The patient agreed with the plan and demonstrated an understanding of the instructions.  Patient advised to call back or seek an in-person evaluation if the symptoms or condition worsens.  Duration of encounter: .  Total time on encounter, as per AMA guidelines included both the face-to-face and non-face-to-face time personally spent by the physician and/or other qualified health care professional(s) on the day of the encounter (includes time in activities that require the physician or other qualified health care professional and does not include time in activities normally performed by clinical staff). Physician's time may include the following activities when performed: Preparing to see the patient (e.g., pre-charting review of records, searching for previously ordered imaging,  lab work, and nerve conduction tests) Review of prior analgesic pharmacotherapies. Reviewing PMP Interpreting ordered tests (e.g., lab work, imaging, nerve conduction tests) Performing post-procedure evaluations, including interpretation of  diagnostic procedures Obtaining and/or reviewing separately obtained history Performing a medically appropriate examination and/or evaluation Counseling and educating the patient/family/caregiver Ordering medications, tests, or procedures Referring and communicating with other health care professionals (when not separately reported) Documenting clinical information in the electronic or other health record Independently interpreting results (not separately reported) and communicating results to the patient/ family/caregiver Care coordination (not separately reported)  Note by: Edward Jolly, MD Date: 03/07/2023; Time: 3:02 PM

## 2023-03-07 NOTE — Progress Notes (Signed)
Nursing Pain Medication Assessment:  Safety precautions to be maintained throughout the outpatient stay will include: orient to surroundings, keep bed in low position, maintain call bell within reach at all times, provide assistance with transfer out of bed and ambulation.  Medication Inspection Compliance: Andrea Blake did not comply with our request to bring her pills to be counted. She was reminded that bringing the medication bottles, even when empty, is a requirement.  Medication: None brought in. Pill/Patch Count: None available to be counted. Bottle Appearance: No container available. Did not bring bottle(s) to appointment. Filled Date: N/A Last Medication intake:  Today Safety precautions to be maintained throughout the outpatient stay will include: orient to surroundings, keep bed in low position, maintain call bell within reach at all times, provide assistance with transfer out of bed and ambulation.

## 2023-03-17 ENCOUNTER — Ambulatory Visit
Admission: RE | Admit: 2023-03-17 | Discharge: 2023-03-17 | Disposition: A | Discharge status: Home / Self Care | Payer: Medicare Other | Source: Ambulatory Visit | Attending: Internal Medicine | Admitting: Internal Medicine

## 2023-03-17 DIAGNOSIS — Z1231 Encounter for screening mammogram for malignant neoplasm of breast: Secondary | ICD-10-CM | POA: Diagnosis present

## 2023-04-06 ENCOUNTER — Ambulatory Visit
Payer: Medicare Other | Attending: Student in an Organized Health Care Education/Training Program | Admitting: Student in an Organized Health Care Education/Training Program

## 2023-04-06 ENCOUNTER — Encounter: Payer: Self-pay | Admitting: Student in an Organized Health Care Education/Training Program

## 2023-04-06 ENCOUNTER — Ambulatory Visit
Admission: RE | Admit: 2023-04-06 | Discharge: 2023-04-06 | Disposition: A | Discharge status: Home / Self Care | Payer: Medicare Other | Source: Ambulatory Visit | Attending: Student in an Organized Health Care Education/Training Program | Admitting: Student in an Organized Health Care Education/Training Program

## 2023-04-06 DIAGNOSIS — M47816 Spondylosis without myelopathy or radiculopathy, lumbar region: Secondary | ICD-10-CM | POA: Insufficient documentation

## 2023-04-06 MED ORDER — ROPIVACAINE HCL 2 MG/ML IJ SOLN
18.0000 mL | Freq: Once | INTRAMUSCULAR | Status: AC
  Administered 2023-04-06: 18 mL via PERINEURAL
  Filled 2023-04-06: qty 20

## 2023-04-06 MED ORDER — DEXAMETHASONE SODIUM PHOSPHATE 10 MG/ML IJ SOLN
20.0000 mg | Freq: Once | INTRAMUSCULAR | Status: AC
  Administered 2023-04-06: 20 mg
  Filled 2023-04-06: qty 2

## 2023-04-06 MED ORDER — LIDOCAINE HCL 2 % IJ SOLN
20.0000 mL | Freq: Once | INTRAMUSCULAR | Status: AC
  Administered 2023-04-06: 400 mg
  Filled 2023-04-06: qty 40

## 2023-04-06 NOTE — Progress Notes (Signed)
Safety precautions to be maintained throughout the outpatient stay will include: orient to surroundings, keep bed in low position, maintain call bell within reach at all times, provide assistance with transfer out of bed and ambulation.  

## 2023-04-06 NOTE — Progress Notes (Signed)
PROVIDER NOTE: Interpretation of information contained herein should be left to medically-trained personnel. Specific patient instructions are provided elsewhere under "Patient Instructions" section of medical record. This document was created in part using STT-dictation technology, any transcriptional errors that may result from this process are unintentional.  Patient: Andrea Blake Type: Established DOB: Oct 17, 1931 MRN: [id number] PCP: [facility] [place] Primary  Service: Procedure DOS: 04/06/2023 Setting: Ambulatory Location: Ambulatory outpatient facility Delivery: Face-to-face Provider: Edward Jolly, MD Specialty: Interventional Pain Management Specialty designation: 09 Location: Outpatient facility Ref. Prov.: Edward Jolly, MD       Interventional Therapy   Type: Lumbar Facet, Medial Branch Block(s) (w/ fluoroscopic mapping) #1  Laterality: Bilateral  Level: L3, L4, and L5 Medial Branch Level(s). Injecting these levels blocks the L3-4 and L4-5 lumbar facet joints. Imaging: Fluoroscopic guidance         Anesthesia: Local anesthesia (1-2% Lidocaine) Sedation: No Sedation                       DOS: 04/06/2023 Performed by: Edward Jolly, MD  Primary Purpose: Diagnostic/Therapeutic Indications: Low back pain severe enough to impact quality of life or function. 1. Lumbar facet arthropathy   2. Lumbar spondylosis    NAS-11 Pain score:   Pre-procedure: 2 /10   Post-procedure: 2 /10     Position / Prep / Materials:  Position: Prone  Prep solution: ChloraPrep (2% chlorhexidine gluconate and 70% isopropyl alcohol) Area Prepped: Posterolateral Lumbosacral Spine (Wide prep: From the lower border of the scapula down to the end of the tailbone and from flank to flank.)  Materials:  Tray: Block Needle(s):  Type: Spinal  Gauge (G): 22  Length: 5-in Qty: 2      H&P (Pre-op Assessment):  Andrea Blake is a 89 y.o. (year old), female patient, seen today for interventional  treatment. She  has a past surgical history that includes Joint replacement; Colon surgery; Hernia repair; Cholecystectomy; Cataract extraction (Bilateral); Rotator cuff repair (Right); and Excision Morton's neuroma (Left). Andrea Blake has a current medication list which includes the following prescription(s): acetaminophen, alendronate, amlodipine, bisacodyl, oyster shell calcium/d, diphenhydramine, duloxetine, hydrochlorothiazide, levothyroxine, loratadine, losartan, magnesium oxide, melatonin, omega-3 fish oil, omeprazole, polyethylene glycol, and tramadol. Her primarily concern today is the Back Pain (lower)  Initial Vital Signs:  Pulse/HCG Rate: 70ECG Heart Rate: 78 Temp: (!) 97.5 F (36.4 C) Resp: 16 BP: (!) 159/69 SpO2: 100 %  BMI: Estimated body mass index is 33.2 kg/m as calculated from the following:   Height as of this encounter: 5' (1.524 m).   Weight as of this encounter: 170 lb (77.1 kg).  Risk Assessment: Allergies: Reviewed. She is allergic to other, sulfa antibiotics, sulfasalazine, amitriptyline, flagyl [metronidazole], gabapentin, penicillins, ciprofloxacin, and levofloxacin.  Allergy Precautions: None required Coagulopathies: Reviewed. None identified.  Blood-thinner therapy: None at this time Active Infection(s): Reviewed. None identified. Andrea Blake is afebrile  Site Confirmation: Andrea Blake was asked to confirm the procedure and laterality before marking the site Procedure checklist: Completed Consent: Before the procedure and under the influence of no sedative(s), amnesic(s), or anxiolytics, the patient was informed of the treatment options, risks and possible complications. To fulfill our ethical and legal obligations, as recommended by the American Medical Association's Code of Ethics, I have informed the patient of my clinical impression; the nature and purpose of the treatment or procedure; the risks, benefits, and possible complications of the intervention;  the alternatives, including doing nothing; the risk(s) and benefit(s) of the alternative  treatment(s) or procedure(s); and the risk(s) and benefit(s) of doing nothing. The patient was provided information about the general risks and possible complications associated with the procedure. These may include, but are not limited to: failure to achieve desired goals, infection, bleeding, organ or nerve damage, allergic reactions, paralysis, and death. In addition, the patient was informed of those risks and complications associated to Spine-related procedures, such as failure to decrease pain; infection (i.e.: Meningitis, epidural or intraspinal abscess); bleeding (i.e.: epidural hematoma, subarachnoid hemorrhage, or any other type of intraspinal or peri-dural bleeding); organ or nerve damage (i.e.: Any type of peripheral nerve, nerve root, or spinal cord injury) with subsequent damage to sensory, motor, and/or autonomic systems, resulting in permanent pain, numbness, and/or weakness of one or several areas of the body; allergic reactions; (i.e.: anaphylactic reaction); and/or death. Furthermore, the patient was informed of those risks and complications associated with the medications. These include, but are not limited to: allergic reactions (i.e.: anaphylactic or anaphylactoid reaction(s)); adrenal axis suppression; blood sugar elevation that in diabetics may result in ketoacidosis or comma; water retention that in patients with history of congestive heart failure may result in shortness of breath, pulmonary edema, and decompensation with resultant heart failure; weight gain; swelling or edema; medication-induced neural toxicity; particulate matter embolism and blood vessel occlusion with resultant organ, and/or nervous system infarction; and/or aseptic necrosis of one or more joints. Finally, the patient was informed that Medicine is not an exact science; therefore, there is also the possibility of unforeseen or  unpredictable risks and/or possible complications that may result in a catastrophic outcome. The patient indicated having understood very clearly. We have given the patient no guarantees and we have made no promises. Enough time was given to the patient to ask questions, all of which were answered to the patient's satisfaction. Andrea Blake has indicated that she wanted to continue with the procedure. Attestation: I, the ordering provider, attest that I have discussed with the patient the benefits, risks, side-effects, alternatives, likelihood of achieving goals, and potential problems during recovery for the procedure that I have provided informed consent. Date  Time: 04/06/2023 11:23 AM   Pre-Procedure Preparation:  Monitoring: As per clinic protocol. Respiration, ETCO2, SpO2, BP, heart rate and rhythm monitor placed and checked for adequate function Safety Precautions: Patient was assessed for positional comfort and pressure points before starting the procedure. Time-out: I initiated and conducted the "Time-out" before starting the procedure, as per protocol. The patient was asked to participate by confirming the accuracy of the "Time Out" information. Verification of the correct person, site, and procedure were performed and confirmed by me, the nursing staff, and the patient. "Time-out" conducted as per Joint Commission's Universal Protocol (UP.01.01.01). Time: 1147 Start Time: 1147 hrs.  Description of Procedure:          Laterality: (see above) Targeted Levels: (see above)  Safety Precautions: Aspiration looking for blood return was conducted prior to all injections. At no point did we inject any substances, as a needle was being advanced. Before injecting, the patient was told to immediately notify me if she was experiencing any new onset of "ringing in the ears, or metallic taste in the mouth". No attempts were made at seeking any paresthesias. Safe injection practices and needle disposal  techniques used. Medications properly checked for expiration dates. SDV (single dose vial) medications used. After the completion of the procedure, all disposable equipment used was discarded in the proper designated medical waste containers. Local Anesthesia: Protocol guidelines were followed.  The patient was positioned over the fluoroscopy table. The area was prepped in the usual manner. The time-out was completed. The target area was identified using fluoroscopy. A 12-in long, straight, sterile hemostat was used with fluoroscopic guidance to locate the targets for each level blocked. Once located, the skin was marked with an approved surgical skin marker. Once all sites were marked, the skin (epidermis, dermis, and hypodermis), as well as deeper tissues (fat, connective tissue and muscle) were infiltrated with a small amount of a short-acting local anesthetic, loaded on a 10cc syringe with a 25G, 1.5-in  Needle. An appropriate amount of time was allowed for local anesthetics to take effect before proceeding to the next step. Local Anesthetic: Lidocaine 2.0% The unused portion of the local anesthetic was discarded in the proper designated containers. Technical description of process:   L3 Medial Branch Nerve Block (MBB): The target area for the L3 medial branch is at the junction of the postero-lateral aspect of the superior articular process and the superior, posterior, and medial edge of the transverse process of L4. Under fluoroscopic guidance, a Quincke needle was inserted until contact was made with os over the superior postero-lateral aspect of the pedicular shadow (target area). After negative aspiration for blood, 2mL of the nerve block solution was injected without difficulty or complication. The needle was removed intact. L4 Medial Branch Nerve Block (MBB): The target area for the L4 medial branch is at the junction of the postero-lateral aspect of the superior articular process and the superior,  posterior, and medial edge of the transverse process of L5. Under fluoroscopic guidance, a Quincke needle was inserted until contact was made with os over the superior postero-lateral aspect of the pedicular shadow (target area). After negative aspiration for blood, 2mL of the nerve block solution was injected without difficulty or complication. The needle was removed intact. L5 Medial Branch Nerve Block (MBB): The target area for the L5 medial branch is at the junction of the postero-lateral aspect of the superior articular process and the superior, posterior, and medial edge of the sacral ala. Under fluoroscopic guidance, a Quincke needle was inserted until contact was made with os over the superior postero-lateral aspect of the pedicular shadow (target area). After negative aspiration for blood, 2mL of the nerve block solution was injected without difficulty or complication. The needle was removed intact.   Once the entire procedure was completed, the treated area was cleaned, making sure to leave some of the prepping solution back to take advantage of its long term bactericidal properties.         Illustration of the posterior view of the lumbar spine and the posterior neural structures. Laminae of L2 through S1 are labeled. DPRL5, dorsal primary ramus of L5; DPRS1, dorsal primary ramus of S1; DPR3, dorsal primary ramus of L3; FJ, facet (zygapophyseal) joint L3-L4; I, inferior articular process of L4; LB1, lateral branch of dorsal primary ramus of L1; IAB, inferior articular branches from L3 medial branch (supplies L4-L5 facet joint); IBP, intermediate branch plexus; MB3, medial branch of dorsal primary ramus of L3; NR3, third lumbar nerve root; S, superior articular process of L5; SAB, superior articular branches from L4 (supplies L4-5 facet joint also); TP3, transverse process of L3.   Facet Joint Innervation (* possible contribution)  L1-2 T12, L1 (L2*)  Medial Branch  L2-3 L1, L2 (L3*)          "          "  L3-4 L2, L3 (  L4*)         "          "  L4-5 L3, L4 (L5*)         "          "  L5-S1 L4, L5, S1          "          "    Vitals:   04/06/23 1127 04/06/23 1147 04/06/23 1155  BP: (!) 159/69 (!) 190/88 (!) 178/83  Pulse: 70    Resp: 16 18 16   Temp: (!) 97.5 F (36.4 C)    SpO2: 100% 98% 99%  Weight: 170 lb (77.1 kg)    Height: 5' (1.524 m)       End Time: 1154 hrs.  Imaging Guidance (Spinal):          Type of Imaging Technique: Fluoroscopy Guidance (Spinal) Indication(s): Fluoroscopy guidance for needle placement to enhance accuracy in procedures requiring precise needle localization for targeted delivery of medication in or near specific anatomical locations not easily accessible without such real-time imaging assistance. Exposure Time: Please see nurses notes. Contrast: None used. Fluoroscopic Guidance: I was personally present during the use of fluoroscopy. "Tunnel Vision Technique" used to obtain the best possible view of the target area. Parallax error corrected before commencing the procedure. "Direction-depth-direction" technique used to introduce the needle under continuous pulsed fluoroscopy. Once target was reached, antero-posterior, oblique, and lateral fluoroscopic projection used confirm needle placement in all planes. Images permanently stored in EMR. Interpretation: No contrast injected. I personally interpreted the imaging intraoperatively. Adequate needle placement confirmed in multiple planes. Permanent images saved into the patient's record.  Post-operative Assessment:  Post-procedure Vital Signs:  Pulse/HCG Rate: 7077 Temp: (!) 97.5 F (36.4 C) Resp: 16 BP: (!) 178/83 SpO2: 99 %  EBL: None  Complications: No immediate post-treatment complications observed by team, or reported by patient.  Note: The patient tolerated the entire procedure well. A repeat set of vitals were taken after the procedure and the patient was kept under observation  following institutional policy, for this type of procedure. Post-procedural neurological assessment was performed, showing return to baseline, prior to discharge. The patient was provided with post-procedure discharge instructions, including a section on how to identify potential problems. Should any problems arise concerning this procedure, the patient was given instructions to immediately contact us, at any time, without hesitation. In any case, we plan to contact the patient by telephone for a follow-up status report regarding this interventional procedure.  Comments:  No additional relevant information.  Plan of Care (POC)  Orders:  Orders Placed This Encounter  Procedures   DG PAIN CLINIC C-ARM 1-60 MIN NO REPORT    Intraoperative interpretation by procedural physician at [facility].    Standing Status:   Standing    Number of Occurrences:   1    Reason for exam::   Assistance in needle guidance and placement for procedures requiring needle placement in or near specific anatomical locations not easily accessible without such assistance.   Chronic Opioid Analgesic:  Tramadol 50 mg BID prn   Medications ordered for procedure: Meds ordered this encounter  Medications   lidocaine (XYLOCAINE) 2 % (with pres) injection 400 mg   dexamethasone (DECADRON) injection 20 mg    This is for a two (2) level block. Use two (2) syringes and divide content in half.   ropivacaine (PF) 2 mg/mL (0.2%) (NAROPIN) injection 18 mL   Medications  administered: We administered lidocaine, dexamethasone, and ropivacaine (PF) 2 mg/mL (0.2%).  See the medical record for exact dosing, route, and time of administration.  Follow-up plan:   Return in about 4 weeks (around 05/04/2023) for F2F PPE.       BLF L3-5 11/17/22, B/L L3 and L4 TF ESI      Recent Visits Date Type Provider Dept  03/07/23 Office Visit Edward Jolly, MD [facility]  Showing recent visits within past 90 days and  meeting all other requirements Today's Visits Date Type Provider Dept  04/06/23 Procedure visit Edward Jolly, MD [facility]  Showing today's visits and meeting all other requirements Future Appointments Date Type Provider Dept  05/04/23 Appointment Edward Jolly, MD [facility]  Showing future appointments within next 90 days and meeting all other requirements  Disposition: Discharge home  Discharge (Date  Time): 04/06/2023; 1200 hrs.   Primary Care Physician: [facility], [place] Primary Location: [facility] Note by: Edward Jolly, MD (TTS technology used. I apologize for any typographical errors that were not detected and corrected.) Date: 04/06/2023; Time: 12:04 PM  Disclaimer:  Medicine is not an Visual merchandiser. The only guarantee in medicine is that nothing is guaranteed. It is important to note that the decision to proceed with this intervention was based on the information collected from the patient. The Data and conclusions were drawn from the patient's questionnaire, the interview, and the physical examination. Because the information was provided in large part by the patient, it cannot be guaranteed that it has not been purposely or unconsciously manipulated. Every effort has been made to obtain as much relevant data as possible for this evaluation. It is important to note that the conclusions that lead to this procedure are derived in large part from the available data. Always take into account that the treatment will also be dependent on availability of resources and existing treatment guidelines, considered by other Pain Management Practitioners as being common knowledge and practice, at the time of the intervention. For Medico-Legal purposes, it is also important to point out that variation in procedural techniques and pharmacological choices are the acceptable norm. The indications, contraindications, technique, and results of the above  procedure should only be interpreted and judged by a Board-Certified Interventional Pain Specialist with extensive familiarity and expertise in the same exact procedure and technique.

## 2023-04-06 NOTE — Patient Instructions (Signed)
Pain Management Discharge Instructions  General Discharge Instructions :  If you need to reach your doctor call: Monday-Friday 8:00 am - 4:00 pm at [phone] or toll free [phone].  After clinic hours [phone] to have operator reach doctor.  Bring all of your medication bottles to all your appointments in the pain clinic.  To cancel or reschedule your appointment with Pain Management please remember to call 24 hours in advance to avoid a fee.  Refer to the educational materials which you have been given on: General Risks, I had my Procedure. Discharge Instructions, Post Sedation.  Post Procedure Instructions:  The drugs you were given will stay in your system until tomorrow, so for the next 24 hours you should not drive, make any legal decisions or drink any alcoholic beverages.  You may eat anything you prefer, but it is better to start with liquids then soups and crackers, and gradually work up to solid foods.  Please notify your doctor immediately if you have any unusual bleeding, trouble breathing or pain that is not related to your normal pain.  Depending on the type of procedure that was done, some parts of your body may feel week and/or numb.  This usually clears up by tonight or the next day.  Walk with the use of an assistive device or accompanied by an adult for the 24 hours.  You may use ice on the affected area for the first 24 hours.  Put ice in a Ziploc bag and cover with a towel and place against area 15 minutes on 15 minutes off.  You may switch to heat after 24 hours.Facet Blocks Patient Information  Description: The facets are joints in the spine between the vertebrae.  Like any joints in the body, facets can become irritated and painful.  Arthritis can also effect the facets.  By injecting steroids and local anesthetic in and around these joints, we can temporarily block the nerve supply to them.  Steroids act directly on irritated nerves and tissues to  reduce selling and inflammation which often leads to decreased pain.  Facet blocks may be done anywhere along the spine from the neck to the low back depending upon the location of your pain.   After numbing the skin with local anesthetic (like Novocaine), a small needle is passed onto the facet joints under x-ray guidance.  You may experience a sensation of pressure while this is being done.  The entire block usually lasts about 15-25 minutes.   Conditions which may be treated by facet blocks:  Low back/buttock pain Neck/shoulder pain Certain types of headaches  Preparation for the injection:  Do not eat any solid food or dairy products within 8 hours of your appointment. You may drink clear liquid up to 3 hours before appointment.  Clear liquids include water, black coffee, juice or soda.  No milk or cream please. You may take your regular medication, including pain medications, with a sip of water before your appointment.  Diabetics should hold regular insulin (if taken separately) and take 1/2 normal NPH dose the morning of the procedure.  Carry some sugar containing items with you to your appointment. A driver must accompany you and be prepared to drive you home after your procedure. Bring all your current medications with you. An IV may be inserted and sedation may be given at the discretion of the physician. A blood pressure cuff, EKG and other monitors will often be applied during the procedure.  Some patients may need to   have extra oxygen administered for a short period. You will be asked to provide medical information, including your allergies and medications, prior to the procedure.  We must know immediately if you are taking blood thinners (like Coumadin/Warfarin) or if you are allergic to IV iodine contrast (dye).  We must know if you could possible be pregnant.  Possible side-effects:  Bleeding from needle site Infection (rare, may require surgery) Nerve injury (rare) Numbness  & tingling (temporary) Difficulty urinating (rare, temporary) Spinal headache (a headache worse with upright posture) Light-headedness (temporary) Pain at injection site (serveral days) Decreased blood pressure (rare, temporary) Weakness in arm/leg (temporary) Pressure sensation in back/neck (temporary)   Call if you experience:  Fever/chills associated with headache or increased back/neck pain Headache worsened by an upright position New onset, weakness or numbness of an extremity below the injection site Hives or difficulty breathing (go to the emergency room) Inflammation or drainage at the injection site(s) Severe back/neck pain greater than usual New symptoms which are concerning to you  Please note:  Although the local anesthetic injected can often make your back or neck feel good for several hours after the injection, the pain will likely return. It takes 3-7 days for steroids to work.  You may not notice any pain relief for at least one week.  If effective, we will often do a series of 2-3 injections spaced 3-6 weeks apart to maximally decrease your pain.  After the initial series, you may be a candidate for a more permanent nerve block of the facets.  If you have any questions, please call [phone] [facility] 

## 2023-04-13 ENCOUNTER — Telehealth: Payer: Self-pay | Admitting: *Deleted

## 2023-04-13 NOTE — Telephone Encounter (Signed)
Pharmacy from [facility] called and needed ICD 10 codes in order for pain Rx to go through.  Info given and Rx is now going through for patient.  Pharmacist, Lawson Fiscal.

## 2023-05-03 ENCOUNTER — Encounter: Payer: Self-pay | Admitting: Student in an Organized Health Care Education/Training Program

## 2023-05-03 ENCOUNTER — Ambulatory Visit
Payer: Medicare Other | Attending: Student in an Organized Health Care Education/Training Program | Admitting: Student in an Organized Health Care Education/Training Program

## 2023-05-03 VITALS — BP 161/66 | HR 69 | Temp 97.2°F | Resp 16 | Ht 60.0 in | Wt 171.0 lb

## 2023-05-03 DIAGNOSIS — G894 Chronic pain syndrome: Secondary | ICD-10-CM | POA: Diagnosis present

## 2023-05-03 DIAGNOSIS — M47816 Spondylosis without myelopathy or radiculopathy, lumbar region: Secondary | ICD-10-CM | POA: Diagnosis present

## 2023-05-03 DIAGNOSIS — M5136 Other intervertebral disc degeneration, lumbar region with discogenic back pain only: Secondary | ICD-10-CM | POA: Diagnosis present

## 2023-05-03 DIAGNOSIS — M542 Cervicalgia: Secondary | ICD-10-CM | POA: Insufficient documentation

## 2023-05-03 DIAGNOSIS — M47812 Spondylosis without myelopathy or radiculopathy, cervical region: Secondary | ICD-10-CM | POA: Insufficient documentation

## 2023-05-03 DIAGNOSIS — M792 Neuralgia and neuritis, unspecified: Secondary | ICD-10-CM | POA: Diagnosis present

## 2023-05-03 MED ORDER — TRAMADOL HCL 50 MG PO TABS
50.0000 mg | ORAL_TABLET | Freq: Two times a day (BID) | ORAL | 2 refills | Status: DC | PRN
Start: 2023-05-28 — End: 2023-08-18

## 2023-05-03 NOTE — Progress Notes (Signed)
PROVIDER NOTE: Information contained herein reflects review and annotations entered in association with encounter. Interpretation of such information and data should be left to medically-trained personnel. Information provided to patient can be located elsewhere in the medical record under "Patient Instructions". Document created using STT-dictation technology, any transcriptional errors that may result from process are unintentional.    Patient: Andrea Blake  Service Category: E/M  Provider: Edward Jolly, MD  DOB: 07-19-1931  DOS: 05/03/2023  Referring Provider: [facility]  MRN: [id number]  Specialty: Interventional Pain Management  PCP: [facility]  Type: Established Patient  Setting: Ambulatory outpatient    Location: Office  Delivery: Face-to-face     HPI  [name], a 89 y.o. year old female, is here today because of her Cervicalgia [M54.2]. [name] primary complain today is Back Pain  Pertinent problems: [name] has Lumbar facet arthropathy; Spinal stenosis, lumbar region, with neurogenic claudication; Lumbar degenerative disc disease; Chronic pain syndrome; and Chronic radicular lumbar pain on their pertinent problem list. Pain Assessment: Severity of Chronic pain is reported as a 2 /10. Location: Back Lower, Right, Left/Denies. Onset: More than a month ago. Quality: Aching, Constant. Timing: Constant. Modifying factor(s): Sitting and heating pad. Vitals:  height is 5' (1.524 m) and weight is 171 lb (77.6 kg). Her temporal temperature is 97.2 F (36.2 C) (abnormal). Her blood pressure is 161/66 (abnormal) and her pulse is 69. Her respiration is 16 and oxygen saturation is 99%.  BMI: Estimated body mass index is 33.4 kg/m as calculated from the following:   Height as of this encounter: 5' (1.524 m).   Weight as of this encounter: 171 lb (77.6 kg). Last encounter: 03/07/2023. Last procedure: 04/06/2023.  Reason for encounter:  post-procedure evaluation and assessment.   Discussed the use of AI scribe software for clinical note transcription with the patient, who gave verbal consent to proceed.  History of Present Illness   Avalin Blake is an 89 year old female who presents for pain management of chronic low back pain and neck pain.  She has chronic low back pain, which has shown some improvement recently after lumbar MBNB. The response to treatment was initially delayed, but she has noticed some relief over time. She is currently taking tramadol 50 mg once daily and has been taking it since April 06, 2023. She has about 30 days' worth of medication left and can increase the dose to 100 mg if needed, especially on days when the pain is more severe.  She also experiences chronic neck pain, which she attributes to arthritis. The pain is exacerbated by quilting activities, involving sitting in awkward positions and twisting. The pain is localized to her neck and does not radiate to her shoulders or arms, although her shoulders do become tired and sore. She has not tried any topical creams for her neck pain but has used heat, which provides some relief. She describes a sensation of a bump on her neck, similar to an arthritic node on her collarbone, which does not cause pain.        Post-procedure evaluation    Type: Lumbar Facet, Medial Branch Block(s) (w/ fluoroscopic mapping) #1  Laterality: Bilateral  Level: L3, L4, and L5 Medial Branch Level(s). Injecting these levels blocks the L3-4 and L4-5 lumbar facet joints. Imaging: Fluoroscopic guidance         Anesthesia: Local anesthesia (1-2% Lidocaine) Sedation: No Sedation  DOS: 04/06/2023 Performed by: Edward Jolly, MD  Primary Purpose: Diagnostic/Therapeutic Indications: Low back pain severe enough to impact quality of life or function. 1. Lumbar facet arthropathy   2. Lumbar spondylosis    NAS-11 Pain score:   Pre-procedure: 2  /10   Post-procedure: 2 /10     Effectiveness:  Initial hour after procedure: 0 %  Subsequent 4-6 hours post-procedure: 0 %  Analgesia past initial 6 hours: 30 % ("States she is walking better and can more activities than before the procedure")  Ongoing improvement:  Analgesic:  50% Function: Somewhat improved ROM: Somewhat improved    Pharmacotherapy Assessment  Analgesic: Tramadol 50 mg BID prn   Monitoring: [place] PMP: PDMP reviewed during this encounter.       Pharmacotherapy: No side-effects or adverse reactions reported. Compliance: No problems identified. Effectiveness: Clinically acceptable.  Andrea Iba, RN  05/03/2023  2:36 PM  Sign when Signing Visit Safety precautions to be maintained throughout the outpatient stay will include: orient to surroundings, keep bed in low position, maintain call bell within reach at all times, provide assistance with transfer out of bed and ambulation.         No results found for: "CBDTHCR" No results found for: "D8THCCBX" No results found for: "D9THCCBX"  UDS:  Summary  Date Value Ref Range Status  12/15/2022 Note  Final    Comment:    ==================================================================== Compliance Drug Analysis, Ur ==================================================================== Test                             Result       Flag       Units  Drug Present and Declared for Prescription Verification   Tramadol                       >3521        EXPECTED   ng/mg creat   O-Desmethyltramadol            2561         EXPECTED   ng/mg creat   N-Desmethyltramadol            1020         EXPECTED   ng/mg creat    Source of tramadol is a prescription medication. O-desmethyltramadol    and N-desmethyltramadol are expected metabolites of tramadol.    Duloxetine                     PRESENT      EXPECTED   Acetaminophen                  PRESENT       EXPECTED ==================================================================== Test                      Result    Flag   Units      Ref Range   Creatinine              142              mg/dL      >=82 ==================================================================== Declared Medications:  The flagging and interpretation on this report are based on the  following declared medications.  Unexpected results may arise from  inaccuracies in the declared medications.   **Note: The testing scope of this panel includes these medications:   Duloxetine (Cymbalta)  Tramadol (Ultram)   **Note:  The testing scope of this panel does not include small to  moderate amounts of these reported medications:   Acetaminophen (Tylenol)   **Note: The testing scope of this panel does not include the  following reported medications:   Amlodipine (Norvasc)  Bisacodyl (Dulcolax)  Calcium  Famotidine (Pepcid)  Hydrochlorothiazide (Hydrodiuril)  Levothyroxine (Synthroid)  Losartan (Cozaar)  Lubiprostone (Amitiza)  Magnesium (Mag-Ox)  Melatonin  Omega-3 Fatty Acids  Omeprazole (Prilosec)  Supplement  Vitamin D ==================================================================== For clinical consultation, please call [phone]. ====================================================================       ROS  Constitutional: Denies any fever or chills Gastrointestinal: No reported hemesis, hematochezia, vomiting, or acute GI distress Musculoskeletal:  Cervicalgia Neurological: No reported episodes of acute onset apraxia, aphasia, dysarthria, agnosia, amnesia, paralysis, loss of coordination, or loss of consciousness  Medication Review  DULoxetine, Melatonin, Oyster Shell Calcium/D, acetaminophen, alendronate, amLODipine, bisacodyl, diphenhydrAMINE, hydrochlorothiazide, levothyroxine, loratadine, losartan, magnesium oxide, omega-3 fish oil, omeprazole, polyethylene glycol, tacrolimus, and  traMADol  History Review  Allergy: Ms. Ow is allergic to other, sulfa antibiotics, sulfasalazine, amitriptyline, flagyl [metronidazole], gabapentin, penicillins, ciprofloxacin, and levofloxacin. Drug: Ms. Filice  reports no history of drug use. Alcohol:  reports no history of alcohol use. Tobacco:  reports that she has never smoked. She has never used smokeless tobacco. Social: Ms. Mings  reports that she has never smoked. She has never used smokeless tobacco. She reports that she does not drink alcohol and does not use drugs. Medical:  has a past medical history of Arthritis, Diverticulitis, GERD (gastroesophageal reflux disease), Hypertension, and Thyroid disease. Surgical: Ms. Schoch  has a past surgical history that includes Joint replacement; Colon surgery; Hernia repair; Cholecystectomy; Cataract extraction (Bilateral); Rotator cuff repair (Right); and Excision Morton's neuroma (Left). Family: family history is not on file.  Laboratory Chemistry Profile   Renal Lab Results  Component Value Date   BUN 29 (H) 11/24/2016   CREATININE 0.97 11/24/2016   GFRAA >60 11/24/2016   GFRNONAA 53 (L) 11/24/2016    Hepatic No results found for: "AST", "ALT", "ALBUMIN", "ALKPHOS", "HCVAB", "AMYLASE", "LIPASE", "AMMONIA"  Electrolytes Lab Results  Component Value Date   NA 139 11/24/2016   K 3.2 (L) 11/24/2016   CL 104 11/24/2016   CALCIUM 8.7 (L) 11/24/2016    Bone No results found for: "VD25OH", "VD125OH2TOT", "OZ3086VH8", "IO9629BM8", "25OHVITD1", "25OHVITD2", "25OHVITD3", "TESTOFREE", "TESTOSTERONE"  Inflammation (CRP: Acute Phase) (ESR: Chronic Phase) No results found for: "CRP", "ESRSEDRATE", "LATICACIDVEN"       Note: Above Lab results reviewed.   Physical Exam  General appearance: Well nourished, well developed, and well hydrated. In no apparent acute distress Mental status: Alert, oriented x 3 (person, place, & time)       Respiratory: No evidence of acute  respiratory distress Eyes: PERLA Vitals: BP (!) 161/66 (Patient Position: Sitting, Cuff Size: Normal)   Pulse 69   Temp (!) 97.2 F (36.2 C) (Temporal)   Resp 16   Ht 5' (1.524 m)   Wt 171 lb (77.6 kg)   SpO2 99%   BMI 33.40 kg/m  BMI: Estimated body mass index is 33.4 kg/m as calculated from the following:   Height as of this encounter: 5' (1.524 m).   Weight as of this encounter: 171 lb (77.6 kg). Ideal: Ideal body weight: 45.5 kg (100 lb 4.9 oz) Adjusted ideal body weight: 58.3 kg (128 lb 9.4 oz)  Cervicalgia  Assessment   Diagnosis Status  1. Cervicalgia   2. Cervical spine arthritis with nerve pain   3.  Lumbar facet arthropathy   4. Lumbar spondylosis   5. Degeneration of intervertebral disc of lumbar region with discogenic back pain   6. Chronic pain syndrome    Controlled Controlled Controlled   Updated Problems: No problems updated.  Plan of Care  Problem-specific:  Assessment and Plan    Low Back Pain Chronic low back pain has recently improved, with previous lumbar medial branch nerve block providing relief. Nerve ablation was discussed, highlighting risks such as infection, bleeding, and nerve damage, alongside benefits like longer pain relief. If pain increases, tramadol dosage may be doubled to 100 mg. Monitor for increased pain and repeat nerve block if necessary.  Allow tramadol up to 100 mg if needed for increased pain.  Neck Pain Chronic neck pain is likely due to muscle strain from quilting, with possible underlying arthritis. Pain is localized to the neck without radiation. Heat provides some relief. Over-the-counter topical gels and massage therapy were discussed. Informed consent was given for potential injections if topical treatments are ineffective, with risks including infection and temporary pain increase. Order a neck x-ray, apply heat, and use topical gels like Voltaren or Bengay. Consider massage therapy and injections if topical treatments  are ineffective.  General Health Maintenance Current medications are managing well. Refill tramadol prescription on March 15 for 90 days.  Follow-up Follow up with radiology for neck x-ray. Call the clinic if low back pain increases for a repeat nerve block. Monitor MyChart for x-ray results.       Ms. Kimbly Eanes has a current medication list which includes the following long-term medication(s): amlodipine, oyster shell calcium/d, diphenhydramine, duloxetine, hydrochlorothiazide, levothyroxine, loratadine, and losartan.  Pharmacotherapy (Medications Ordered): Meds ordered this encounter  Medications   traMADol (ULTRAM) 50 MG tablet    Sig: Take 1 tablet (50 mg total) by mouth every 12 (twelve) hours as needed.    Dispense:  60 tablet    Refill:  2   Orders:  Orders Placed This Encounter  Procedures   LUMBAR FACET(MEDIAL BRANCH NERVE BLOCK) MBNB    Standing Status:   Standing    Number of Occurrences:   2    Next Expected Occurrence:   07/22/2023    Expiration Date:   05/02/2024    Scheduling Instructions:     Procedure: Lumbar facet block (AKA.: Lumbosacral medial branch nerve block)     Side: Bilateral     Level: L3-4, L4-5, and L5-S1 Facets (L3, L4, L5,Medial Branch)     Sedation: without     Timeframe:PRN    Where will this procedure be performed?:   [facility]   DG Cervical Spine Complete    Patient presents with axial pain with possible radicular component. Please assist [place] in identifying specific level(s) and laterality of any additional findings such as: 1. Facet (Zygapophyseal) joint DJD (Hypertrophy, space narrowing, subchondral sclerosis, and/or osteophyte formation) 2. DDD and/or IVDD (Loss of disc height, desiccation, gas patterns, osteophytes, endplate sclerosis, or "Black disc disease") 3. Pars defects 4. Spondylolisthesis, spondylosis, and/or spondyloarthropathies (include Degree/Grade of displacement in mm) (stability) 5. Vertebral body  Fractures (acute/chronic) (state percentage of collapse) 6. Demineralization (osteopenia/osteoporotic) 7. Bone pathology 8. Foraminal narrowing  9. Surgical changes    Standing Status:   Future    Expiration Date:   07/31/2023    Scheduling Instructions:     Please make sure that the patient understands that this needs to be done as soon as possible. Never have the patient do the imaging "  just before the next appointment". Inform patient that having the imaging done within the [facility] will expedite the availability of the results and will provide      imaging availability to the requesting physician. In addition inform the patient that the imaging order has an expiration date and will not be renewed if not done within the active period.    Reason for Exam (SYMPTOM  OR DIAGNOSIS REQUIRED):   Cervicalgia    Preferred imaging location?:   [facility]    Call Results- Best Contact Number?:   [phone]   Follow-up plan:   Return in about 4 months (around 08/31/2023) for MM, F2F.      BLF L3-5 11/17/22, B/L L3 and L4 TF ESI      Recent Visits Date Type Provider Dept  04/06/23 Procedure visit Edward Jolly, MD [facility]  03/07/23 Office Visit Edward Jolly, MD [facility]  Showing recent visits within past 90 days and meeting all other requirements Today's Visits Date Type Provider Dept  05/03/23 Office Visit Edward Jolly, MD [facility]  Showing today's visits and meeting all other requirements Future Appointments No visits were found meeting these conditions. Showing future appointments within next 90 days and meeting all other requirements  I discussed the assessment and treatment plan with the patient. The patient was provided an opportunity to ask questions and all were answered. The patient agreed with the plan and demonstrated an understanding of the instructions.  Patient advised to call back or seek an in-person evaluation if the  symptoms or condition worsens.  Duration of encounter: .  Total time on encounter, as per AMA guidelines included both the face-to-face and non-face-to-face time personally spent by the physician and/or other qualified health care professional(s) on the day of the encounter (includes time in activities that require the physician or other qualified health care professional and does not include time in activities normally performed by clinical staff). Physician's time may include the following activities when performed: Preparing to see the patient (e.g., pre-charting review of records, searching for previously ordered imaging, lab work, and nerve conduction tests) Review of prior analgesic pharmacotherapies. Reviewing PMP Interpreting ordered tests (e.g., lab work, imaging, nerve conduction tests) Performing post-procedure evaluations, including interpretation of diagnostic procedures Obtaining and/or reviewing separately obtained history Performing a medically appropriate examination and/or evaluation Counseling and educating the patient/family/caregiver Ordering medications, tests, or procedures Referring and communicating with other health care professionals (when not separately reported) Documenting clinical information in the electronic or other health record Independently interpreting results (not separately reported) and communicating results to the patient/ family/caregiver Care coordination (not separately reported)  Note by: Edward Jolly, MD Date: 05/03/2023; Time: 2:42 PM

## 2023-05-03 NOTE — Progress Notes (Signed)
 Safety precautions to be maintained throughout the outpatient stay will include: orient to surroundings, keep bed in low position, maintain call bell within reach at all times, provide assistance with transfer out of bed and ambulation.

## 2023-05-03 NOTE — Patient Instructions (Signed)

## 2023-05-04 ENCOUNTER — Ambulatory Visit: Payer: Medicare Other | Admitting: Student in an Organized Health Care Education/Training Program

## 2023-05-27 ENCOUNTER — Emergency Department
Admission: EM | Admit: 2023-05-27 | Discharge: 2023-05-27 | Disposition: A | Discharge status: Home / Self Care | Attending: Emergency Medicine | Admitting: Emergency Medicine

## 2023-05-27 ENCOUNTER — Emergency Department

## 2023-05-27 ENCOUNTER — Other Ambulatory Visit: Payer: Self-pay

## 2023-05-27 DIAGNOSIS — I10 Essential (primary) hypertension: Secondary | ICD-10-CM | POA: Insufficient documentation

## 2023-05-27 DIAGNOSIS — S0083XA Contusion of other part of head, initial encounter: Secondary | ICD-10-CM | POA: Insufficient documentation

## 2023-05-27 DIAGNOSIS — W19XXXA Unspecified fall, initial encounter: Secondary | ICD-10-CM

## 2023-05-27 DIAGNOSIS — S61512A Laceration without foreign body of left wrist, initial encounter: Secondary | ICD-10-CM | POA: Diagnosis not present

## 2023-05-27 DIAGNOSIS — Y92481 Parking lot as the place of occurrence of the external cause: Secondary | ICD-10-CM | POA: Diagnosis not present

## 2023-05-27 DIAGNOSIS — S6992XA Unspecified injury of left wrist, hand and finger(s), initial encounter: Secondary | ICD-10-CM | POA: Diagnosis present

## 2023-05-27 DIAGNOSIS — W01198A Fall on same level from slipping, tripping and stumbling with subsequent striking against other object, initial encounter: Secondary | ICD-10-CM | POA: Diagnosis not present

## 2023-05-27 NOTE — ED Notes (Signed)
 Per XRAY tech pt refused chest XRAY

## 2023-05-27 NOTE — ED Provider Notes (Signed)
 [facility] Provider Note    Event Date/Time   First MD Initiated Contact with Patient 05/27/23 1818     (approximate)   History   Chief Complaint Fall   HPI  Andrea Blake is a 89 y.o. female with past medical history of hypertension, GERD, and chronic pain syndrome who presents to the ED following fall.  Patient reports that she tripped on the curb in the grocery store parking lot just prior to arrival, falling forward and hitting her head as well as her left wrist and knee.  She denies losing consciousness, has noticed significant pain and swelling around the left side of her face.  She reports pain on the side of her face, denies neck pain.  She also noticed a skin tear to her left wrist, but denies any pain upon moving the wrist.  She struck her left knee but denies significant pain in the knee and has been able to ambulate without difficulty.  She does not take any blood thinners.     Physical Exam   Triage Vital Signs: ED Triage Vitals [05/27/23 1644]  Encounter Vitals Group     BP (!) 174/66     Systolic BP Percentile      Diastolic BP Percentile      Pulse Rate 72     Resp 20     Temp 98 F (36.7 C)     Temp Source Oral     SpO2 98 %     Weight      Height      Head Circumference      Peak Flow      Pain Score 7     Pain Loc      Pain Education      Exclude from Growth Chart     Most recent vital signs: Vitals:   05/27/23 1644  BP: (!) 174/66  Pulse: 72  Resp: 20  Temp: 98 F (36.7 C)  SpO2: 98%    Constitutional: Alert and oriented. Eyes: Conjunctivae are normal. Head: Atraumatic. Nose: No congestion/rhinnorhea. Mouth/Throat: Mucous membranes are moist.  Neck: No midline cervical spine tenderness to palpation. Cardiovascular: Normal rate, regular rhythm. Grossly normal heart sounds.  2+ radial pulses bilaterally. Respiratory: Normal respiratory effort.  No retractions. Lungs CTAB.  No chest wall tenderness to  palpation. Gastrointestinal: Soft and nontender. No distention. Musculoskeletal: No lower extremity tenderness nor edema.  Skin tear to the dorsum of the left wrist with no bony tenderness to palpation. Neurologic:  Normal speech and language. No gross focal neurologic deficits are appreciated.    ED Results / Procedures / Treatments   Labs (all labs ordered are listed, but only abnormal results are displayed) Labs Reviewed - No data to display   RADIOLOGY CT head reviewed and interpreted by me with no hemorrhage or midline shift.  PROCEDURES:  Critical Care performed: No  Procedures   MEDICATIONS ORDERED IN ED: Medications - No data to display   IMPRESSION / MDM / ASSESSMENT AND PLAN / ED COURSE  I reviewed the triage vital signs and the nursing notes.                              89 y.o. female with past medical history of hypertension, GERD, and chronic pain syndrome who presents to the ED complaining of left facial pain following trip and fall on a curb just prior to arrival.  Patient's presentation is most consistent with acute complicated illness / injury requiring diagnostic workup.  Differential diagnosis includes, but is not limited to, intracranial injury, facial fracture, cervical spine injury, skin tear, extremity fracture.  Patient nontoxic-appearing and in no acute distress, vital signs are unremarkable.  CT head, maxillofacial, and cervical spine are negative for acute process.  She has a skin tear to her left wrist but no evidence of bony injury given range of motion intact without pain.  Skin tear was repaired with Steri-Strips following irrigation, no evidence of traumatic injury to her trunk or lower extremities.  Patient appropriate for discharge home with outpatient follow-up, was counseled to return to the ED for new or worsening symptoms.  Patient agrees with plan.      FINAL CLINICAL IMPRESSION(S) / ED DIAGNOSES   Final diagnoses:  Fall, initial  encounter  Contusion of face, initial encounter  Tear of skin of left wrist, initial encounter     Rx / DC Orders   ED Discharge Orders     None        Note:  This document was prepared using Dragon voice recognition software and may include unintentional dictation errors.   Chesley Noon, MD 05/27/23 [phone]

## 2023-05-27 NOTE — ED Triage Notes (Signed)
 Pt to ED via [facility] from grocery store. Pt had mechanical fall after tripping over curb. Pt fell on left side and struck face on ground. Pt denies LOC. No blood thinners. Pt A&Ox4. PT with app 4 in skin tear on left forearm wrapped by FD and swelling and bruising to left eye/forehead. Pt hypertensive with EMS in 200s. Pt took BP meds this am.

## 2023-08-18 ENCOUNTER — Encounter: Payer: Self-pay | Admitting: Student in an Organized Health Care Education/Training Program

## 2023-08-18 ENCOUNTER — Ambulatory Visit
Attending: Student in an Organized Health Care Education/Training Program | Admitting: Student in an Organized Health Care Education/Training Program

## 2023-08-18 VITALS — BP 128/61 | HR 63 | Temp 97.4°F | Resp 16 | Ht 60.0 in | Wt 165.0 lb

## 2023-08-18 DIAGNOSIS — M792 Neuralgia and neuritis, unspecified: Secondary | ICD-10-CM | POA: Diagnosis present

## 2023-08-18 DIAGNOSIS — G894 Chronic pain syndrome: Secondary | ICD-10-CM | POA: Diagnosis present

## 2023-08-18 DIAGNOSIS — M542 Cervicalgia: Secondary | ICD-10-CM | POA: Insufficient documentation

## 2023-08-18 DIAGNOSIS — M5136 Other intervertebral disc degeneration, lumbar region with discogenic back pain only: Secondary | ICD-10-CM | POA: Insufficient documentation

## 2023-08-18 DIAGNOSIS — M47816 Spondylosis without myelopathy or radiculopathy, lumbar region: Secondary | ICD-10-CM | POA: Diagnosis not present

## 2023-08-18 DIAGNOSIS — M47812 Spondylosis without myelopathy or radiculopathy, cervical region: Secondary | ICD-10-CM | POA: Diagnosis present

## 2023-08-18 MED ORDER — BUPRENORPHINE 5 MCG/HR TD PTWK: 1.0000 | MEDICATED_PATCH | TRANSDERMAL | 2 refills | Status: AC

## 2023-08-18 MED ORDER — TRAMADOL HCL 50 MG PO TABS
50.0000 mg | ORAL_TABLET | Freq: Two times a day (BID) | ORAL | 2 refills | Status: DC | PRN
Start: 2023-10-02 — End: 2023-11-17

## 2023-08-18 NOTE — Progress Notes (Signed)
 PROVIDER NOTE: Interpretation of information contained herein should be left to medically-trained personnel. Specific patient instructions are provided elsewhere under "Patient Instructions" section of medical record. This document was created in part using AI and STT-dictation technology, any transcriptional errors that may result from this process are unintentional.  Patient: Andrea Blake  Service: E/M   PCP: [facility]  DOB: [birth year]  DOS: 08/18/2023  Provider: Cephus Collin, MD  MRN: [id number]  Delivery: Face-to-face  Specialty: Interventional Pain Management  Type: Established Patient  Setting: Ambulatory outpatient facility  Specialty designation: 09  Referring Prov.: [facility]  Location: Outpatient office facility       History of present illness (HPI) [name], a 89 y.o. year old female, is here today because of her Cervicalgia [M54.2]. [name]'s primary complain today is Back Pain (Mid back and down ), Hip Pain (Right ), Knee Pain (Left s/p fall ), and Other (Facial left side under eye s/p fall. CT scan done at the time after fall )  Pertinent problems: Ms. Bring has Lumbar facet arthropathy; Spinal stenosis, lumbar region, with neurogenic claudication; Lumbar degenerative disc disease; Chronic pain syndrome; and Chronic radicular lumbar pain on their pertinent problem list.  Pain Assessment: Severity of Chronic pain is reported as a 1 /10. Location: Back Mid, Lower, Left, Right/? into right hip. Onset: More than a month ago. Quality: Tingling, Discomfort, Constant, Tender (left knee is hot and swollen after fall, 1st March 14th and 2nd fall 9 days ago). Timing: Constant. Modifying factor(s): sitting or resting, medications   patient went to PT in an effort to build strength in her legs, it has aggravated her back and has not improved.. Vitals:  height is 5' (1.524 m) and weight is 165 lb (74.8 kg). Her temporal temperature is 97.4 F  (36.3 C) (abnormal). Her blood pressure is 128/61 and her pulse is 63. Her respiration is 16 and oxygen saturation is 94%.  BMI: Estimated body mass index is 32.22 kg/m as calculated from the following:   Height as of this encounter: 5' (1.524 m).   Weight as of this encounter: 165 lb (74.8 kg).  Last encounter: 05/03/2023. Last procedure: 04/06/2023.  Reason for encounter: medication management.   Discussed the use of AI scribe software for clinical note transcription with the patient, who gave verbal consent to proceed.  History of Present Illness   Andrea Blake is an 89 year old female who presents with falls and worsening lower back pain.  She has experienced two recent falls. The first occurred when she tripped over a curb while going to the grocery store, and the second happened when she was getting up from a chair and her toe caught in the rug. She sustained a significant cut from the fall on March 14, which required Steri strips as her skin would not hold stitches. The cut has healed well since then. She has started using a cane to help with mobility and prevent further falls.  She reports worsening lower back pain, which she attributes to re-injury during physical therapy. The pain is chronic and has been managed with tramadol . Salonpas patches have been helpful, although she struggles to apply them to her back.  She has been using Benadryl, prescribed at half a dose for itching. She is concerned about the potential link between frequent Benadryl use and dementia, as she takes it daily.        Pharmacotherapy Assessment  Analgesic:  Tramadol  50 mg  BID prn   Monitoring: [place] PMP: PDMP reviewed during this encounter.       Pharmacotherapy: No side-effects or adverse reactions reported. Compliance: No problems identified. Effectiveness: Clinically acceptable.  UDS:  Summary  Date Value Ref Range Status  12/15/2022 Note  Final    Comment:     ==================================================================== Compliance Drug Analysis, Ur ==================================================================== Test                             Result       Flag       Units  Drug Present and Declared for Prescription Verification   Tramadol                        >3521        EXPECTED   ng/mg creat   O-Desmethyltramadol            2561         EXPECTED   ng/mg creat   N-Desmethyltramadol            1020         EXPECTED   ng/mg creat    Source of tramadol  is a prescription medication. O-desmethyltramadol    and N-desmethyltramadol are expected metabolites of tramadol .    Duloxetine                     PRESENT      EXPECTED   Acetaminophen                  PRESENT      EXPECTED ==================================================================== Test                      Result    Flag   Units      Ref Range   Creatinine              142              mg/dL      >=16 ==================================================================== Declared Medications:  The flagging and interpretation on this report are based on the  following declared medications.  Unexpected results may arise from  inaccuracies in the declared medications.   **Note: The testing scope of this panel includes these medications:   Duloxetine (Cymbalta)  Tramadol  (Ultram )   **Note: The testing scope of this panel does not include small to  moderate amounts of these reported medications:   Acetaminophen (Tylenol)   **Note: The testing scope of this panel does not include the  following reported medications:   Amlodipine (Norvasc)  Bisacodyl (Dulcolax)  Calcium  Famotidine (Pepcid)  Hydrochlorothiazide (Hydrodiuril)  Levothyroxine (Synthroid)  Losartan (Cozaar)  Lubiprostone (Amitiza)  Magnesium (Mag-Ox)  Melatonin  Omega-3 Fatty Acids  Omeprazole (Prilosec)  Supplement  Vitamin  D ==================================================================== For clinical consultation, please call [phone]. ====================================================================       ROS  Constitutional: Denies any fever or chills Gastrointestinal: No reported hemesis, hematochezia, vomiting, or acute GI distress Musculoskeletal: +low back pain Neurological: No reported episodes of acute onset apraxia, aphasia, dysarthria, agnosia, amnesia, paralysis, loss of coordination, or loss of consciousness  Medication Review  DULoxetine, Melatonin, Oyster Shell Calcium/D, acetaminophen, amLODipine, buprenorphine, clobetasol cream, diphenhydrAMINE, hydrochlorothiazide, levothyroxine, loratadine, losartan, magnesium oxide, omega-3 fish oil, omeprazole, ondansetron, polyethylene glycol, sodium fluoride, tacrolimus, and traMADol   History Review  Allergy: Ms. Spina is allergic to other, sulfa antibiotics, sulfasalazine, amitriptyline,  flagyl [metronidazole], gabapentin, penicillins, ciprofloxacin, and levofloxacin. Drug: Ms. Troiani  reports no history of drug use. Alcohol:  reports no history of alcohol use. Tobacco:  reports that she has never smoked. She has never used smokeless tobacco. Social: Ms. Sieh  reports that she has never smoked. She has never used smokeless tobacco. She reports that she does not drink alcohol and does not use drugs. Medical:  has a past medical history of Arthritis, Diverticulitis, GERD (gastroesophageal reflux disease), Hypertension, and Thyroid disease. Surgical: Ms. Shilling  has a past surgical history that includes Joint replacement; Colon surgery; Hernia repair; Cholecystectomy; Cataract extraction (Bilateral); Rotator cuff repair (Right); and Excision Morton's neuroma (Left). Family: family history is not on file.  Laboratory Chemistry Profile   Renal Lab Results  Component Value Date   BUN 29 (H) 11/24/2016   CREATININE 0.97  11/24/2016   GFRAA >60 11/24/2016   GFRNONAA 53 (L) 11/24/2016    Hepatic No results found for: "AST", "ALT", "ALBUMIN", "ALKPHOS", "HCVAB", "AMYLASE", "LIPASE", "AMMONIA"  Electrolytes Lab Results  Component Value Date   NA 139 11/24/2016   K 3.2 (L) 11/24/2016   CL 104 11/24/2016   CALCIUM 8.7 (L) 11/24/2016    Bone No results found for: "VD25OH", "VD125OH2TOT", "ZO1096EA5", "WU9811BJ4", "25OHVITD1", "25OHVITD2", "25OHVITD3", "TESTOFREE", "TESTOSTERONE"  Inflammation (CRP: Acute Phase) (ESR: Chronic Phase) No results found for: "CRP", "ESRSEDRATE", "LATICACIDVEN"       Note: Above Lab results reviewed.  Recent Imaging Review  CT MAXILLOFACIAL WO CONTRAST CLINICAL DATA:  Marvell Slider with trauma to the left side of the face and head  EXAM: CT HEAD WITHOUT CONTRAST  CT MAXILLOFACIAL WITHOUT CONTRAST  CT CERVICAL SPINE WITHOUT CONTRAST  TECHNIQUE: Multidetector CT imaging of the head, cervical spine, and maxillofacial structures were performed using the standard protocol without intravenous contrast. Multiplanar CT image reconstructions of the cervical spine and maxillofacial structures were also generated.  RADIATION DOSE REDUCTION: This exam was performed according to the departmental dose-optimization program which includes automated exposure control, adjustment of the mA and/or kV according to patient size and/or use of iterative reconstruction technique.  COMPARISON:  None Available.  FINDINGS: CT HEAD FINDINGS  Brain: Age related atrophy. Chronic small-vessel ischemic changes of the white matter. No evidence of acute infarction, mass lesion, hemorrhage, hydrocephalus or extra-axial collection.  Vascular: There is atherosclerotic calcification of the major vessels at the base of the brain.  Skull: Negative  Other: None  CT MAXILLOFACIAL FINDINGS  Osseous: No regional fracture with specific attention to the left side.  Orbits: No evidence of soft tissue  intraorbital injury. Periorbital soft tissue swelling superficially on the left.  Sinuses: No traumatic sinus disease. There is some chronic opacification in the right ethmoid region with mild expansion that could indicate mucocele formation.  Soft tissues: Mild soft tissue swelling also of the left cheek.  CT CERVICAL SPINE FINDINGS  Alignment: No traumatic malalignment. Degenerative anterolisthesis of 2 mm at C7-T1.  Skull base and vertebrae: No regional fracture or focal bone lesion.  Soft tissues and spinal canal: No soft tissue injury.  Disc levels: Chronic degenerative change of the C1-2 articulation. Chronic degenerative spondylosis from C3-4 through C6-7. Chronic facet osteoarthritis. No apparent compressive bony stenosis of the canal. Foraminal narrowing left worse than right at C3-4 could possibly cause neural compression.  Upper chest: Negative  Other: None  IMPRESSION: HEAD CT:  No acute or traumatic finding. Age related atrophy and chronic small-vessel ischemic changes of the white matter.  MAXILLOFACIAL CT:  No acute or traumatic finding. Periorbital soft tissue swelling superficially on the left. Mild soft tissue swelling also of the left cheek.  CERVICAL SPINE CT:  No acute or traumatic finding. Chronic degenerative spondylosis and facet osteoarthritis as outlined above. Foraminal narrowing left worse than right at C3-4 could possibly cause neural compression.  Electronically Signed   By: Bettylou Brunner M.D.   On: 05/27/2023 17:24 CT Cervical Spine Wo Contrast CLINICAL DATA:  Marvell Slider with trauma to the left side of the face and head  EXAM: CT HEAD WITHOUT CONTRAST  CT MAXILLOFACIAL WITHOUT CONTRAST  CT CERVICAL SPINE WITHOUT CONTRAST  TECHNIQUE: Multidetector CT imaging of the head, cervical spine, and maxillofacial structures were performed using the standard protocol without intravenous contrast. Multiplanar CT image reconstructions of the  cervical spine and maxillofacial structures were also generated.  RADIATION DOSE REDUCTION: This exam was performed according to the departmental dose-optimization program which includes automated exposure control, adjustment of the mA and/or kV according to patient size and/or use of iterative reconstruction technique.  COMPARISON:  None Available.  FINDINGS: CT HEAD FINDINGS  Brain: Age related atrophy. Chronic small-vessel ischemic changes of the white matter. No evidence of acute infarction, mass lesion, hemorrhage, hydrocephalus or extra-axial collection.  Vascular: There is atherosclerotic calcification of the major vessels at the base of the brain.  Skull: Negative  Other: None  CT MAXILLOFACIAL FINDINGS  Osseous: No regional fracture with specific attention to the left side.  Orbits: No evidence of soft tissue intraorbital injury. Periorbital soft tissue swelling superficially on the left.  Sinuses: No traumatic sinus disease. There is some chronic opacification in the right ethmoid region with mild expansion that could indicate mucocele formation.  Soft tissues: Mild soft tissue swelling also of the left cheek.  CT CERVICAL SPINE FINDINGS  Alignment: No traumatic malalignment. Degenerative anterolisthesis of 2 mm at C7-T1.  Skull base and vertebrae: No regional fracture or focal bone lesion.  Soft tissues and spinal canal: No soft tissue injury.  Disc levels: Chronic degenerative change of the C1-2 articulation. Chronic degenerative spondylosis from C3-4 through C6-7. Chronic facet osteoarthritis. No apparent compressive bony stenosis of the canal. Foraminal narrowing left worse than right at C3-4 could possibly cause neural compression.  Upper chest: Negative  Other: None  IMPRESSION: HEAD CT:  No acute or traumatic finding. Age related atrophy and chronic small-vessel ischemic changes of the white matter.  MAXILLOFACIAL CT:  No acute or  traumatic finding. Periorbital soft tissue swelling superficially on the left. Mild soft tissue swelling also of the left cheek.  CERVICAL SPINE CT:  No acute or traumatic finding. Chronic degenerative spondylosis and facet osteoarthritis as outlined above. Foraminal narrowing left worse than right at C3-4 could possibly cause neural compression.  Electronically Signed   By: Bettylou Brunner M.D.   On: 05/27/2023 17:24 CT Head Wo Contrast CLINICAL DATA:  Marvell Slider with trauma to the left side of the face and head  EXAM: CT HEAD WITHOUT CONTRAST  CT MAXILLOFACIAL WITHOUT CONTRAST  CT CERVICAL SPINE WITHOUT CONTRAST  TECHNIQUE: Multidetector CT imaging of the head, cervical spine, and maxillofacial structures were performed using the standard protocol without intravenous contrast. Multiplanar CT image reconstructions of the cervical spine and maxillofacial structures were also generated.  RADIATION DOSE REDUCTION: This exam was performed according to the departmental dose-optimization program which includes automated exposure control, adjustment of the mA and/or kV according to patient size and/or use of iterative reconstruction technique.  COMPARISON:  None Available.  FINDINGS: CT HEAD FINDINGS  Brain: Age related atrophy. Chronic small-vessel ischemic changes of the white matter. No evidence of acute infarction, mass lesion, hemorrhage, hydrocephalus or extra-axial collection.  Vascular: There is atherosclerotic calcification of the major vessels at the base of the brain.  Skull: Negative  Other: None  CT MAXILLOFACIAL FINDINGS  Osseous: No regional fracture with specific attention to the left side.  Orbits: No evidence of soft tissue intraorbital injury. Periorbital soft tissue swelling superficially on the left.  Sinuses: No traumatic sinus disease. There is some chronic opacification in the right ethmoid region with mild expansion that could indicate mucocele  formation.  Soft tissues: Mild soft tissue swelling also of the left cheek.  CT CERVICAL SPINE FINDINGS  Alignment: No traumatic malalignment. Degenerative anterolisthesis of 2 mm at C7-T1.  Skull base and vertebrae: No regional fracture or focal bone lesion.  Soft tissues and spinal canal: No soft tissue injury.  Disc levels: Chronic degenerative change of the C1-2 articulation. Chronic degenerative spondylosis from C3-4 through C6-7. Chronic facet osteoarthritis. No apparent compressive bony stenosis of the canal. Foraminal narrowing left worse than right at C3-4 could possibly cause neural compression.  Upper chest: Negative  Other: None  IMPRESSION: HEAD CT:  No acute or traumatic finding. Age related atrophy and chronic small-vessel ischemic changes of the white matter.  MAXILLOFACIAL CT:  No acute or traumatic finding. Periorbital soft tissue swelling superficially on the left. Mild soft tissue swelling also of the left cheek.  CERVICAL SPINE CT:  No acute or traumatic finding. Chronic degenerative spondylosis and facet osteoarthritis as outlined above. Foraminal narrowing left worse than right at C3-4 could possibly cause neural compression.  Electronically Signed   By: Bettylou Brunner M.D.   On: 05/27/2023 17:24 Note: Reviewed         Physical Exam  General appearance: Well nourished, well developed, and well hydrated. In no apparent acute distress Mental status: Alert, oriented x 3 (person, place, & time)       Respiratory: No evidence of acute respiratory distress Eyes: PERLA Vitals: BP 128/61 (BP Location: Left Arm, Patient Position: Sitting, Cuff Size: Large)   Pulse 63   Temp (!) 97.4 F (36.3 C) (Temporal)   Resp 16   Ht 5' (1.524 m)   Wt 165 lb (74.8 kg)   SpO2 94%   BMI 32.22 kg/m  BMI: Estimated body mass index is 32.22 kg/m as calculated from the following:   Height as of this encounter: 5' (1.524 m).   Weight as of this encounter: 165  lb (74.8 kg). Ideal: Ideal body weight: 45.5 kg (100 lb 4.9 oz) Adjusted ideal body weight: 57.2 kg (126 lb 3 oz)  Assessment   Diagnosis Status  1. Cervicalgia   2. Cervical spine arthritis with nerve pain   3. Lumbar facet arthropathy   4. Lumbar spondylosis   5. Degeneration of intervertebral disc of lumbar region with discogenic back pain   6. Chronic pain syndrome    Controlled Controlled Controlled   Updated Problems: No problems updated.  Plan of Care  Problem-specific:  Assessment and Plan    Chronic pain   Chronic lower back pain has worsened with physical therapy. Initiate Butrans patch, an opioid similar to tramadol , for chronic pain management. Educated on patch use, including application sites, duration, and precautions. It may not provide immediate relief, and dose adjustments might be necessary. The patch delivers medication continuously and should not be placed on the site of pain.  Start at 5 mcg/hour, alternating patch sites weekly. Avoid using a heating pad over the patch. Showering with the patch is allowed. Follow-up in three months to assess pain management and consider dose adjustment.  Continue tramadol  as needed  Fall with injury   She reported two recent falls, one from tripping over a curb and another from getting up from a chair, resulting in a significant cut requiring Steri strips. She has started using a cane to prevent future falls and is considering a crossbody bag to free up her hands. Encourage continued use of a cane for stability.       Ms. Alianah Lofton has a current medication list which includes the following long-term medication(s): amlodipine, oyster shell calcium/d, diphenhydramine, duloxetine, hydrochlorothiazide, levothyroxine, loratadine, and losartan.  Pharmacotherapy (Medications Ordered): Meds ordered this encounter  Medications   traMADol  (ULTRAM ) 50 MG tablet    Sig: Take 1 tablet (50 mg total) by mouth every 12 (twelve)  hours as needed.    Dispense:  60 tablet    Refill:  2   buprenorphine (BUTRANS) 5 MCG/HR PTWK    Sig: Place 1 patch onto the skin once a week.    Dispense:  4 patch    Refill:  2    Chronic Pain: STOP Act (Not applicable) Fill 1 day early if closed on refill date. Avoid benzodiazepines within 8 hours of opioids   Orders:  No orders of the defined types were placed in this encounter.    BLF L3-5 11/17/22, B/L L3 and L4 TF ESI      Return in about 3 months (around 11/18/2023) for MM, F2F.    Recent Visits No visits were found meeting these conditions. Showing recent visits within past 90 days and meeting all other requirements Today's Visits Date Type Provider Dept  08/18/23 Office Visit Cephus Collin, MD [facility]  Showing today's visits and meeting all other requirements Future Appointments No visits were found meeting these conditions. Showing future appointments within next 90 days and meeting all other requirements  I discussed the assessment and treatment plan with the patient. The patient was provided an opportunity to ask questions and all were answered. The patient agreed with the plan and demonstrated an understanding of the instructions.  Patient advised to call back or seek an in-person evaluation if the symptoms or condition worsens.  Duration of encounter: .  Total time on encounter, as per AMA guidelines included both the face-to-face and non-face-to-face time personally spent by the physician and/or other qualified health care professional(s) on the day of the encounter (includes time in activities that require the physician or other qualified health care professional and does not include time in activities normally performed by clinical staff). Physician's time may include the following activities when performed: Preparing to see the patient (e.g., pre-charting review of records, searching for previously ordered imaging, lab work, and nerve  conduction tests) Review of prior analgesic pharmacotherapies. Reviewing PMP Interpreting ordered tests (e.g., lab work, imaging, nerve conduction tests) Performing post-procedure evaluations, including interpretation of diagnostic procedures Obtaining and/or reviewing separately obtained history Performing a medically appropriate examination and/or evaluation Counseling and educating the patient/family/caregiver Ordering medications, tests, or procedures Referring and communicating with other health care professionals (when not separately reported) Documenting clinical information in the electronic or other health record Independently interpreting results (not separately reported) and communicating results to the patient/ family/caregiver Care coordination (not separately reported)  Note by: Cephus Collin, MD (TTS and AI technology used.  I apologize for any typographical errors that were not detected and corrected.) Date: 08/18/2023; Time: 11:01 AM

## 2023-08-18 NOTE — Progress Notes (Signed)
 Nursing Pain Medication Assessment:  Safety precautions to be maintained throughout the outpatient stay will include: orient to surroundings, keep bed in low position, maintain call bell within reach at all times, provide assistance with transfer out of bed and ambulation.  Medication Inspection Compliance: Andrea Blake did not comply with our request to bring her pills to be counted. She was reminded that bringing the medication bottles, even when empty, is a requirement.  Medication: None brought in. Pill/Patch Count: None available to be counted. Bottle Appearance: No container available. Did not bring bottle(s) to appointment. Filled Date: N/A Last Medication intake:  Today

## 2023-08-23 ENCOUNTER — Encounter: Payer: Medicare Other | Admitting: Student in an Organized Health Care Education/Training Program

## 2023-08-25 ENCOUNTER — Encounter: Admitting: Student in an Organized Health Care Education/Training Program

## 2023-10-14 ENCOUNTER — Ambulatory Visit: Payer: Self-pay | Admitting: Podiatry

## 2023-10-14 ENCOUNTER — Encounter: Payer: Self-pay | Admitting: Podiatry

## 2023-10-14 ENCOUNTER — Ambulatory Visit (INDEPENDENT_AMBULATORY_CARE_PROVIDER_SITE_OTHER)

## 2023-10-14 VITALS — Ht 60.0 in | Wt 165.0 lb

## 2023-10-14 DIAGNOSIS — M2012 Hallux valgus (acquired), left foot: Secondary | ICD-10-CM | POA: Diagnosis not present

## 2023-10-14 DIAGNOSIS — M79675 Pain in left toe(s): Secondary | ICD-10-CM | POA: Diagnosis not present

## 2023-10-14 DIAGNOSIS — B351 Tinea unguium: Secondary | ICD-10-CM | POA: Diagnosis not present

## 2023-10-14 DIAGNOSIS — M7752 Other enthesopathy of left foot: Secondary | ICD-10-CM | POA: Diagnosis not present

## 2023-10-14 DIAGNOSIS — M79674 Pain in right toe(s): Secondary | ICD-10-CM

## 2023-10-14 NOTE — Progress Notes (Signed)
   Chief Complaint  Patient presents with   Difficulty Walking    Pt is here due to left foot issue she states that she is having trouble walking due to left foot, states her foot has been giving her problems for many years, seen a podiatrist several years ago and was fitted for orthotics which she states helps, but wants to understand what is going on with the foot.    SUBJECTIVE Patient presents to office today complaining of elongated, thickened nails that cause pain while ambulating in shoes.  Patient is unable to trim their own nails.  Patient also has a history of bunion and hammertoe deformity and would like to have it evaluated as well.  Patient is here for further evaluation and treatment.  Past Medical History:  Diagnosis Date   Arthritis    Diverticulitis    GERD (gastroesophageal reflux disease)    Hypertension    Thyroid disease     Allergies  Allergen Reactions   Other Nausea Only    Crab:  Diarrhea an vomiting.   Sulfa Antibiotics Hives   Sulfasalazine Hives   Amitriptyline Other (See Comments)    sedating  sedating    Flagyl [Metronidazole]    Gabapentin Other (See Comments)    dizziness  dizziness    Penicillins    Ciprofloxacin Rash    Fixed macular whole body rash one day after oral Cipro course complete.   Levofloxacin Nausea Only     OBJECTIVE General Patient is awake, alert, and oriented x 3 and in no acute distress. Derm Skin is dry and supple bilateral. Negative open lesions or macerations. Remaining integument unremarkable. Nails are tender, long, thickened and dystrophic with subungual debris, consistent with onychomycosis, 1-5 bilateral. No signs of infection noted. Vasc  DP and PT pedal pulses palpable bilaterally. Temperature gradient within normal limits.  Neuro grossly intact via light touch Musculoskeletal Exam pes planovalgus deformity noted with bunion and hammertoe deformity of second digit left foot Radiographic exam LT foot  10/14/2023 pes planovalgus deformity noted with collapse of the medial longitudinal arch of the foot on lateral view.  Hallux valgus with medial deviation of the first metatarsal head and hammertoe contracture deformities grossly to the second digit left foot noted  ASSESSMENT 1.  Pain due to onychomycosis of toenails both 2.  Hallux valgus and hammertoe deformity of the second digit left 3.  Pes planovalgus left  PLAN OF CARE -Patient evaluated today.  X-rays reviewed -Instructed to maintain good pedal hygiene and foot care.  -Mechanical debridement of nails 1-5 bilaterally performed using a nail nipper. Filed with dremel without incident.  -Recommend good supportive tennis shoes available at [employer] running store -Continue custom accommodative orthotics.  She is also requesting a second pair of prefabricated insoles.  Recommend Aetrex insoles -Return to clinic PRN   Thresa EMERSON Sar, DPM [facility]  Dr. Thresa EMERSON Sar, DPM    [address] [place], [place] [address]                Office [phone]  Fax [phone]

## 2023-10-14 NOTE — Patient Instructions (Signed)
 Aetrex Insoles available at [employer]

## 2023-10-17 ENCOUNTER — Ambulatory Visit: Payer: Self-pay | Admitting: Podiatry

## 2023-10-25 NOTE — Progress Notes (Signed)
 Assessment and Plan:  Encounter for Medicare Wellness exam - see additional note Primary hypertension  -    BP at goal  -    Continue current Rx:  -     amlodipine (NORVASC) 5 MG tablet; Take 1 tablet (5 mg total) by mouth nightly. -     Losartan 100 mg po every day - hydrochlorothiazide 25 mg  po every day.   Chronic low back pain      Andrea Blake is taking Duloxetine 20 mg po every day Andrea Blake is going to pain clinic and is taking tramadol  daily prn. Andrea Blake also taking Tylenol prn.  Gastroesophageal reflux disease without esophagitis      -     Symptoms are stable.       -      Continue famotidine 20 mg po every day.  MGUS: ? Found as part of work up for Pruritus By Dr. Estefana Barrier.  Andrea Blake denies any bone pain, abnormal bleeding or bruising.  Andrea Blake was referred for E consult to Dr. Volanda. Andrea Blake was advised monitor of SPEP and FLC in 6 months.  - Monoclonal Gammopathy Workup; Future OAB: Followed by Urology. - Andrea Blake is taking Gemtasa75 mg po every day and this is helping Acquired hypothyroidism        -      TFTs checked 08/19/22 and was normal  -      Continue Levothyroxine 50 mcg po every day.  - Recheck TFTs today - T4, Free - TSH Osteopenia Andrea Blake was advised fosamax weekly by endocrinologist.  Andrea Blake has not received medications and states Andrea Blake does not wish to take this medication.   Chronic right shoulder pain - Andrea Blake s/p reverse total shoulder arthroplasty  with Dr. Carolee 10/20/2021  - Andrea Blake denies injury or recent fall onto shoulder.  - Andrea Blake has decreased ROM and cannot raise arm above shoulder secondary to pain.  - Pain in anterior right shoudler ? Rotator cuff problem.  - Andrea Blake is advised to make follow up with Dr. Carolee orthopedics to evaluate shoulder pain.  Bilateral leg pain/ Difficulty walking: Andrea Blake requests to go to [facility] therapy for PT and requests Michael.  HM:  Flu vaccine not in season Medicare wellness  today Potassium monitoring.     I personally spent  35 minutes face-to-face and non-face-to-face in the care of this Andrea Blake, which includes all pre, intra, and post visit time on the date of service. All documented time was specific to the E/M visit and does not include any procedures that may have been performed.  Subjective:   HPI: Andrea Blake is a 89 y.o. female here for Follow-up complete physical examination, Subsequent Medicare wellness exam and follow up all chronic conditions.     Hypertension:Andrea Blake follows up HTN. BP has been controlled. Andrea Blake is taking amlodipine 5 mg po every day, Losartan 100 mg po every day.AM Hydrochlorothiazide 12.5 mg po every day am. Hypothyroidism:Andrea Blake is asymptomatic. Denies fatigue, constipation, weight gain. Andrea Blake is taking Levothyroxine 50 mcg po every day.  Chronic low back pain:   Andrea Blake is taking Duloxetine 20 mg po every day Andrea Blake is taking Tramadol  as directed by pain management. MGUS: ? Found as part of work up for Pruritus By Dr. Estefana Barrier. Andrea Blake was referred for E consult to Dr. Volanda. Andrea Blake was advised monitor of SPEP and FLC in 6 months..  Urinary incontinence/ OAB:  Followed by Urology. - Andrea Blake is taking Gemtasa75 mg po every day and this is helping  Osteoporosis: Andrea Blake was advised fosamax weekly by endocrinologist.  Andrea Blake has not received medications yet so has not started.  Chronic pain of right shoulder - Andrea Blake s/p reverse total shoulder arthroplasty with Dr. Carolee 10/20/2021  - Andrea Blake denies injury or recent fall onto shoulder.  - Andrea Blake states it gets locked and pain when raising right arm at shoulder.  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 - Andrea Blake is taking tylenol prn.   HM:  Andrea Blake received Flu vaccine elsewhere. ROS negative unless otherwise noted in HPI.     Allergies:   Metronidazole hcl, Sulfa (sulfonamide antibiotics), Sulfasalazine, Amitriptyline,  Gabapentin, Metronidazole, Ciprofloxacin, Levofloxacin, and Penicillins  Current Medications:   Current Outpatient Medications  Medication Sig Dispense Refill  . acetaminophen (TYLENOL) 500 MG tablet Take 2 tablets (1,000 mg total) by mouth every eight (8) hours as needed for pain (mild to moderate). 90 tablet 0  . amlodipine (NORVASC) 5 MG tablet Take 1 tablet (5 mg total) by mouth nightly. 90 tablet 3  . calcium-vitamin D 500 mg-5 mcg (200 unit) per tablet Take 1 tablet by mouth nightly. PRN    . clobetasol (TEMOVATE) 0.05 % ointment Apply a pea-sized amount (0.25 grams) Two (2) times a day to active flares. Use 2 times a week for maintenance. 30 g 2  . DULoxetine (CYMBALTA) 20 MG capsule Take 1 capsule (20 mg total) by mouth nightly. 90 capsule 1  . fluocinonide (LIDEX) 0.05 % external solution Apply topically two (2) times a day. To scalp while flaring and then stop when not flared. Restart as needed. 60 mL 3  . fluoride, sodium, 1.1 % Gel Apply 1 Application topically daily.    . fluticasone propionate (FLONASE) 50 mcg/actuation nasal spray 1 spray into each nostril daily. 16 g 0  . hydroCHLOROthiazide (HYDRODIURIL) 25 MG tablet Take 1 tablet (25 mg total) by mouth daily. 90 tablet 1  . levothyroxine (SYNTHROID) 50 MCG tablet Take 1 tablet (50 mcg total) by mouth daily. 90 tablet 3  . loratadine (CLARITIN REDITABS) 10 mg dissolvable tablet Dissolve 1 tablet (10 mg total) in the mouth daily.    SABRA losartan (COZAAR) 100 MG tablet Take 1 tablet (100 mg total) by mouth daily. 90 tablet 1  . MAGNESIUM ORAL Take 400 mg by mouth nightly.     . melatonin 5 mg cap Take 1 capsule by mouth nightly as needed.  (Andrea Blake taking differently: Take 1 capsule by mouth nightly as needed. PRN)    . tacrolimus (PROTOPIC) 0.1 % ointment Apply 1 Application topically two (2) times a day. (Andrea Blake taking differently: Apply 1 Application topically two (2) times a day. PRN)    . triamcinolone (KENALOG) 0.1 % cream  Apply topically two (2) times a day. (Andrea Blake taking differently: Apply topically two (2) times a day. PRN) 80 g 5  . vibegron 75 mg Tab Take 1 tablet (75 mg total) by mouth in the morning. 30 tablet 3  . walker (ULTRA-LIGHT ROLLATOR) Misc Rollator with seat 1 each 0  . alendronate (FOSAMAX) 70 MG tablet Take 1 tablet (70 mg total) by mouth every seven (7) days. (Andrea Blake not taking: Reported on 10/25/2023) 12 tablet 1  . buprenorphine  5 mcg/hour PTWK transdermal patch Place 1 patch on the skin every seven (7) days. (Andrea Blake not taking: Reported on 10/25/2023)    . fluconazole (DIFLUCAN) 150 MG tablet Take 1 on day one. If still symptomatic after 2 days, take another pill (Andrea Blake not taking:  Reported on 10/25/2023) 2 tablet 2  . fluconazole (DIFLUCAN) 150 MG tablet Take one 150 mg tablet, if not improved in 3 days take second tablet. 2 tablet 0  . mupirocin (BACTROBAN) 2 % ointment Apply to affected area twice daily until scab heals 30 g 1  . nitrofurantoin, macrocrystal-monohydrate, (MACROBID) 100 MG capsule Take 1 capsule (100 mg total) by mouth two (2) times a day. For 7 days (Andrea Blake not taking: Reported on 10/25/2023) 14 capsule 0  . nystatin (MYCOSTATIN) 100,000 unit/gram cream Apply topically two (2) times a day. To the areas under the breasts and in the inguinal creases of the groin. 30 g 5  . OMEGA-3/DHA/EPA/FISH OIL (FISH OIL-OMEGA-3 FATTY ACIDS) 300-1,000 mg capsule Take 1 capsule (1 g total) by mouth daily.    SABRA omeprazole (PRILOSEC) 20 MG capsule TAKE 1 CAPSULE BY MOUTH ONCE DAILY BEFORE BREAKFAST 90 capsule 3  . polyethylene glycol (MIRALAX) 17 gram packet Take 17 g by mouth daily. (Andrea Blake taking differently: Take 17 g by mouth daily. PRN)    . propylene glycol 0.6 % Drop Apply 1-2 drops to eye Three (3) times a day as needed.     . tacrolimus (PROTOPIC) 0.1 % ointment Apply 1 Application topically two (2) times a day. Apply 0.25 grams twice a day. Mix 1:1 with Vaseline 60 g 5  . traMADol   (ULTRAM ) 50 mg tablet Take 1 tablet (50 mg total) by mouth every eight (8) hours as needed for pain. (Andrea Blake taking differently: Take 1 tablet (50 mg total) by mouth every eight (8) hours as needed for pain. PRN) 15 tablet 0   No current facility-administered medications for this visit.    Objective:   Vitals:   10/25/23 0847  Pulse: 65  Temp: 35.8 C (96.5 F)  SpO2: 95%   Body mass index is 32.61 kg/m.  Physical Exam Constitutional:      Appearance: Normal appearance. Andrea Blake is normal weight.  HENT:     Head: Normocephalic and atraumatic.     Mouth/Throat:     Mouth: Mucous membranes are dry.     Pharynx: Oropharynx is clear.  Cardiovascular:     Rate and Rhythm: Normal rate and regular rhythm.     Pulses: Normal pulses.     Heart sounds: Normal heart sounds.  Pulmonary:     Effort: Pulmonary effort is normal.     Breath sounds: Normal breath sounds.  Chest:     Comments:  Breast: bilateral breast exam was performed in upright and supine position. There were no palpable masses, nipple irregularity or skin changes seen today. Both axilla were negative for Lymphadenopathy.  Abdominal:     General: Abdomen is flat. Bowel sounds are normal.     Palpations: Abdomen is soft.  Musculoskeletal:     Cervical back: Normal range of motion and neck supple.  Skin:    General: Skin is warm and dry.     Capillary Refill: Capillary refill takes less than 2 seconds.  Neurological:     General: No focal deficit present.     Mental Status: Andrea Blake is alert and oriented to person, place, and time. Mental status is at baseline.  Psychiatric:        Mood and Affect: Mood normal.        Behavior: Behavior normal.        Thought Content: Thought content normal.        Judgment: Judgment normal.      No results found  for this visit on 10/25/23.  Slater MARLA Sole, M.D.   Internal Medicine [facility] [place] [address] [place], [place] [address]

## 2023-11-17 ENCOUNTER — Encounter: Admitting: Student in an Organized Health Care Education/Training Program

## 2023-11-17 ENCOUNTER — Encounter: Admitting: Nurse Practitioner

## 2023-11-17 ENCOUNTER — Encounter: Payer: Self-pay | Admitting: Nurse Practitioner

## 2023-11-17 ENCOUNTER — Ambulatory Visit: Attending: Student in an Organized Health Care Education/Training Program | Admitting: Nurse Practitioner

## 2023-11-17 VITALS — BP 155/71 | HR 78 | Temp 97.3°F | Ht 60.0 in | Wt 165.0 lb

## 2023-11-17 DIAGNOSIS — M792 Neuralgia and neuritis, unspecified: Secondary | ICD-10-CM | POA: Diagnosis present

## 2023-11-17 DIAGNOSIS — Z79899 Other long term (current) drug therapy: Secondary | ICD-10-CM | POA: Diagnosis present

## 2023-11-17 DIAGNOSIS — G894 Chronic pain syndrome: Secondary | ICD-10-CM | POA: Diagnosis present

## 2023-11-17 DIAGNOSIS — M5136 Other intervertebral disc degeneration, lumbar region with discogenic back pain only: Secondary | ICD-10-CM | POA: Diagnosis present

## 2023-11-17 DIAGNOSIS — M47816 Spondylosis without myelopathy or radiculopathy, lumbar region: Secondary | ICD-10-CM | POA: Insufficient documentation

## 2023-11-17 DIAGNOSIS — M542 Cervicalgia: Secondary | ICD-10-CM | POA: Diagnosis not present

## 2023-11-17 DIAGNOSIS — M47812 Spondylosis without myelopathy or radiculopathy, cervical region: Secondary | ICD-10-CM | POA: Insufficient documentation

## 2023-11-17 MED ORDER — TRAMADOL HCL 50 MG PO TABS: 50.0000 mg | ORAL_TABLET | Freq: Two times a day (BID) | ORAL | 2 refills | Status: DC | PRN

## 2023-11-17 NOTE — Progress Notes (Signed)
 PROVIDER NOTE: Interpretation of information contained herein should be left to medically-trained personnel. Specific patient instructions are provided elsewhere under Patient Instructions section of medical record. This document was created in part using AI and STT-dictation technology, any transcriptional errors that may result from this process are unintentional.  Patient: Andrea Blake  Service: E/M   PCP: Alyse Bradley, MD  DOB: 03/19/1931  DOS: 11/17/2023  Provider: Emmy MARLA Blanch, NP  MRN: [id number]  Delivery: Face-to-face  Specialty: Interventional Pain Management  Type: Established Patient  Setting: Ambulatory outpatient facility  Specialty designation: 09  Referring Prov.: Care, [place] Primary  Location: Outpatient office facility       History of present illness (HPI) [name], a 89 y.o. year old female, is here today because of her Chronic pain syndrome [G89.4]. [name] primary complain today is Neck Pain and Back Pain  Pertinent problems: Andrea Blake has Lumbar facet arthropathy; Spinal stenosis, lumbar region, with neurogenic claudication; Lumbar degenerative disc disease; Chronic pain syndrome; and Chronic radicular lumbar pain on their pertinent problem list.   Pain Assessment: Severity of Chronic pain is reported as a 2 /10. Location: Back Lower/Denies. Onset: More than a month ago. Quality: Dull. Timing: Constant. Modifying factor(s): Rest. Vitals:  height is 5' (1.524 m) and weight is 165 lb (74.8 kg). Her temporal temperature is 97.3 F (36.3 C) (abnormal). Her blood pressure is 155/71 (abnormal) and her pulse is 78. Her oxygen saturation is 98%.  BMI: Estimated body mass index is 32.22 kg/m as calculated from the following:   Height as of this encounter: 5' (1.524 m).   Weight as of this encounter: 165 lb (74.8 kg).  Last encounter: 08/18/2023 Last procedure: Visit date not found.  Reason for encounter: medication management. No change in  medical history since last visit.  Patient's pain is at baseline.  Patient continues multimodal pain regimen as prescribed.  States that it provides pain relief and improvement in functional status.  The patient does not wish to continue with [place]  patch due to high cost and minimal pain relief.  She recently experienced a fall and tripped but denies any injuries, though she reports bruising of the left knee.  She was advised to continue using ice packs for symptomatic relief.  Pharmacotherapy Assessment   Tramadol  (Ultram ) 50 mg every 12 hours as needed for pain. MME=20 Monitoring: [place] PMP: PDMP reviewed during this encounter.       Pharmacotherapy: No side-effects or adverse reactions reported. Compliance: No problems identified. Effectiveness: Clinically acceptable.  [name], [place]  11/17/2023  3:16 PM  Sign when Signing Visit Nursing Pain Medication Assessment:  Safety precautions to be maintained throughout the outpatient stay will include: orient to surroundings, keep bed in low position, maintain call bell within reach at all times, provide assistance with transfer out of bed and ambulation.  Medication Inspection Compliance: Ms. Meraz did not comply with our request to bring her pills to be counted. She was reminded that bringing the medication bottles, even when empty, is a requirement.  Medication: None brought in. Pill/Patch Count: None available to be counted. Bottle Appearance: No container available. Did not bring bottle(s) to appointment. Filled Date: N/A Last Medication intake:  Today    UDS:  Summary  Date Value Ref Range Status  12/15/2022 Note  Final    Comment:    ==================================================================== Compliance Drug Analysis, Ur ==================================================================== Test  Result       Flag       Units  Drug Present and Declared for Prescription Verification   Tramadol                         >3521        EXPECTED   ng/mg creat   O-Desmethyltramadol            2561         EXPECTED   ng/mg creat   N-Desmethyltramadol            1020         EXPECTED   ng/mg creat    Source of tramadol  is a prescription medication. O-desmethyltramadol    and N-desmethyltramadol are expected metabolites of tramadol .    Duloxetine                     PRESENT      EXPECTED   Acetaminophen                  PRESENT      EXPECTED ==================================================================== Test                      Result    Flag   Units      Ref Range   Creatinine              142              mg/dL      >=79 ==================================================================== Declared Medications:  The flagging and interpretation on this report are based on the  following declared medications.  Unexpected results may arise from  inaccuracies in the declared medications.   **Note: The testing scope of this panel includes these medications:   Duloxetine (Cymbalta)  Tramadol  (Ultram )   **Note: The testing scope of this panel does not include small to  moderate amounts of these reported medications:   Acetaminophen (Tylenol)   **Note: The testing scope of this panel does not include the  following reported medications:   Amlodipine (Norvasc)  Bisacodyl (Dulcolax)  Calcium  Famotidine (Pepcid)  Hydrochlorothiazide (Hydrodiuril)  Levothyroxine (Synthroid)  Losartan (Cozaar)  Lubiprostone (Amitiza)  Magnesium (Mag-Ox)  Melatonin  Omega-3 Fatty Acids  Omeprazole (Prilosec)  Supplement  Vitamin D ==================================================================== For clinical consultation, please call [phone]. ====================================================================     No results found for: CBDTHCR No results found for: D8THCCBX No results found for: D9THCCBX  ROS  Constitutional: Denies any fever or  chills Gastrointestinal: No reported hemesis, hematochezia, vomiting, or acute GI distress Musculoskeletal: Denies any acute onset joint swelling, redness, loss of ROM, or weakness Neurological: No reported episodes of acute onset apraxia, aphasia, dysarthria, agnosia, amnesia, paralysis, loss of coordination, or loss of consciousness  Medication Review  DULoxetine, Melatonin, Oyster Shell Calcium/D, acetaminophen, amLODipine, clobetasol cream, diphenhydrAMINE, hydrochlorothiazide, levothyroxine, loratadine, losartan, magnesium oxide, omega-3 fish oil, omeprazole, ondansetron, polyethylene glycol, sodium fluoride, tacrolimus, and traMADol   History Review  Allergy: Ms. Bellino is allergic to other, sulfa antibiotics, sulfasalazine, amitriptyline, flagyl [metronidazole], gabapentin, penicillins, ciprofloxacin, and levofloxacin. Drug: Ms. Sommers  reports no history of drug use. Alcohol:  reports no history of alcohol use. Tobacco:  reports that she has never smoked. She has never used smokeless tobacco. Social: Ms. Riddle  reports that she has never smoked. She has never used smokeless tobacco. She reports that she does not drink alcohol and does not use  drugs. Medical:  has a past medical history of Arthritis, Diverticulitis, GERD (gastroesophageal reflux disease), Hypertension, and Thyroid disease. Surgical: Ms. Marolf  has a past surgical history that includes Joint replacement; Colon surgery; Hernia repair; Cholecystectomy; Cataract extraction (Bilateral); Rotator cuff repair (Right); and Excision Morton's neuroma (Left). Family: family history is not on file.  Laboratory Chemistry Profile   Renal Lab Results  Component Value Date   BUN 29 (H) 11/24/2016   CREATININE 0.97 11/24/2016   GFRAA >60 11/24/2016   GFRNONAA 53 (L) 11/24/2016    Hepatic No results found for: AST, ALT, ALBUMIN, ALKPHOS, HCVAB, AMYLASE, LIPASE, AMMONIA  Electrolytes Lab Results  Component  Value Date   NA 139 11/24/2016   K 3.2 (L) 11/24/2016   CL 104 11/24/2016   CALCIUM 8.7 (L) 11/24/2016    Bone No results found for: VD25OH, [id number], CI6874NY7, CI7874NY7, 25OHVITD1, 25OHVITD2, 25OHVITD3, TESTOFREE, TESTOSTERONE  Inflammation (CRP: Acute Phase) (ESR: Chronic Phase) No results found for: CRP, ESRSEDRATE, LATICACIDVEN       Note: Above Lab results reviewed.  Recent Imaging Review  DG Foot Complete Left Please see detailed radiograph report in office note. Note: Reviewed        Physical Exam  Vitals: BP (!) 155/71 (BP Location: Right Arm)   Pulse 78   Temp (!) 97.3 F (36.3 C) (Temporal)   Ht 5' (1.524 m)   Wt 165 lb (74.8 kg)   SpO2 98%   BMI 32.22 kg/m  BMI: Estimated body mass index is 32.22 kg/m as calculated from the following:   Height as of this encounter: 5' (1.524 m).   Weight as of this encounter: 165 lb (74.8 kg). Ideal: Ideal body weight: 45.5 kg (100 lb 4.9 oz) Adjusted ideal body weight: 57.2 kg (126 lb 3 oz) General appearance: Well nourished, well developed, and well hydrated. In no apparent acute distress Mental status: Alert, oriented x 3 (person, place, & time)       Respiratory: No evidence of acute respiratory distress Eyes: PERLA   Assessment   Diagnosis Status  1. Chronic pain syndrome   2. Cervicalgia   3. Cervical spine arthritis with nerve pain   4. Lumbar facet arthropathy   5. Lumbar spondylosis   6. Degeneration of intervertebral disc of lumbar region with discogenic back pain   7. Medication management    Controlled Controlled Controlled   Updated Problems: No problems updated.  Plan of Care  Problem-specific:  Assessment and Plan We will continue on current medication regimen.  Prescribing drug monitoring (PDMP) reviewed; findings consistent with the use of prescribed medication and no evidence of narcotic misuse or abuse.  Urine drug screening (UDS) up-to-date.  No other new issues or  problems reported at this visit.  Schedule follow-up in 90 days for medication management lisinopril NP.  Ms. Rosann Gorum has a current medication list which includes the following long-term medication(s): amlodipine, oyster shell calcium/d, diphenhydramine, duloxetine, hydrochlorothiazide, levothyroxine, loratadine, and losartan.  Pharmacotherapy (Medications Ordered): Meds ordered this encounter  Medications   traMADol  (ULTRAM ) 50 MG tablet    Sig: Take 1 tablet (50 mg total) by mouth every 12 (twelve) hours as needed.    Dispense:  60 tablet    Refill:  2   Orders:  No orders of the defined types were placed in this encounter.       Return in about 3 months (around 02/16/2024) for (F2F), (MM), Emmy Blanch NP.    Recent Visits No visits were  found meeting these conditions. Showing recent visits within past 90 days and meeting all other requirements Today's Visits Date Type Provider Dept  11/17/23 Office Visit Kash Davie K, NP [facility]  Showing today's visits and meeting all other requirements Future Appointments Date Type Provider Dept  02/15/24 Appointment Anayelli Lai K, NP [facility]  Showing future appointments within next 90 days and meeting all other requirements  I discussed the assessment and treatment plan with the patient. The patient was provided an opportunity to ask questions and all were answered. The patient agreed with the plan and demonstrated an understanding of the instructions.  Patient advised to call back or seek an in-person evaluation if the symptoms or condition worsens.  Duration of encounter: 30 minutes.  Total time on encounter, as per AMA guidelines included both the face-to-face and non-face-to-face time personally spent by the physician and/or other qualified health care professional(s) on the day of the encounter (includes time in activities that require the physician or other qualified health care professional  and does not include time in activities normally performed by clinical staff). Physician's time may include the following activities when performed: Preparing to see the patient (e.g., pre-charting review of records, searching for previously ordered imaging, lab work, and nerve conduction tests) Review of prior analgesic pharmacotherapies. Reviewing PMP Interpreting ordered tests (e.g., lab work, imaging, nerve conduction tests) Performing post-procedure evaluations, including interpretation of diagnostic procedures Obtaining and/or reviewing separately obtained history Performing a medically appropriate examination and/or evaluation Counseling and educating the patient/family/caregiver Ordering medications, tests, or procedures Referring and communicating with other health care professionals (when not separately reported) Documenting clinical information in the electronic or other health record Independently interpreting results (not separately reported) and communicating results to the patient/ family/caregiver Care coordination (not separately reported)  Note by: Mirjana Tarleton K Divit Stipp, NP (TTS and AI technology used. I apologize for any typographical errors that were not detected and corrected.) Date: 11/17/2023; Time: 3:35 PM

## 2023-11-17 NOTE — Progress Notes (Signed)
 Nursing Pain Medication Assessment:  Safety precautions to be maintained throughout the outpatient stay will include: orient to surroundings, keep bed in low position, maintain call bell within reach at all times, provide assistance with transfer out of bed and ambulation.  Medication Inspection Compliance: Andrea Blake did not comply with our request to bring her pills to be counted. She was reminded that bringing the medication bottles, even when empty, is a requirement.  Medication: None brought in. Pill/Patch Count: None available to be counted. Bottle Appearance: No container available. Did not bring bottle(s) to appointment. Filled Date: N/A Last Medication intake:  Today

## 2023-12-16 ENCOUNTER — Telehealth: Payer: Self-pay | Admitting: Student in an Organized Health Care Education/Training Program

## 2023-12-16 NOTE — Telephone Encounter (Signed)
 Advised her to come for appt. She agreed to come Monday to see Seema.  Will you call her to schedule?

## 2023-12-16 NOTE — Telephone Encounter (Signed)
 PT called stated that she is having neck pain all this week. PT stated that she is taking Tramadol  and using heat. PT stated she will like to speak with a nurse. Please give patient a call. TY

## 2023-12-19 ENCOUNTER — Ambulatory Visit: Attending: Nurse Practitioner | Admitting: Nurse Practitioner

## 2023-12-19 ENCOUNTER — Encounter: Payer: Self-pay | Admitting: Nurse Practitioner

## 2023-12-19 VITALS — BP 157/69 | HR 71 | Temp 97.3°F | Resp 14 | Ht 60.0 in | Wt 165.0 lb

## 2023-12-19 DIAGNOSIS — G8929 Other chronic pain: Secondary | ICD-10-CM | POA: Insufficient documentation

## 2023-12-19 DIAGNOSIS — M5416 Radiculopathy, lumbar region: Secondary | ICD-10-CM | POA: Diagnosis present

## 2023-12-19 DIAGNOSIS — M792 Neuralgia and neuritis, unspecified: Secondary | ICD-10-CM | POA: Insufficient documentation

## 2023-12-19 DIAGNOSIS — M47816 Spondylosis without myelopathy or radiculopathy, lumbar region: Secondary | ICD-10-CM | POA: Diagnosis present

## 2023-12-19 DIAGNOSIS — M47812 Spondylosis without myelopathy or radiculopathy, cervical region: Secondary | ICD-10-CM | POA: Insufficient documentation

## 2023-12-19 DIAGNOSIS — M542 Cervicalgia: Secondary | ICD-10-CM | POA: Insufficient documentation

## 2023-12-19 DIAGNOSIS — M4726 Other spondylosis with radiculopathy, lumbar region: Secondary | ICD-10-CM | POA: Diagnosis not present

## 2023-12-19 DIAGNOSIS — G894 Chronic pain syndrome: Secondary | ICD-10-CM | POA: Diagnosis present

## 2023-12-19 NOTE — Progress Notes (Signed)
 PROVIDER NOTE: Interpretation of information contained herein should be left to medically-trained personnel. Specific patient instructions are provided elsewhere under Patient Instructions section of medical record. This document was created in part using AI and STT-dictation technology, any transcriptional errors that may result from this process are unintentional.  Patient: Andrea Blake  Service: E/M   PCP: Alyse Bradley, MD  DOB: 08-19-1931  DOS: 12/19/2023  Provider: Emmy MARLA Blanch, NP  MRN: [id number]  Delivery: Face-to-face  Specialty: Interventional Pain Management  Type: Established Patient  Setting: Ambulatory outpatient facility  Specialty designation: 09  Referring Prov.: Alyse Bradley, MD  Location: Outpatient office facility       History of present illness (HPI) Ms. Clover Feehan, a 89 y.o. year old female, is here today because of her neck pain. Ms. Loiseau primary complain today is Neck Pain  Pertinent problems: Ms. Sterba has Lumbar facet arthropathy; Spinal stenosis, lumbar region, with neurogenic claudication; Lumbar degenerative disc disease; Chronic pain syndrome; and Chronic radicular lumbar pain on their pertinent problem list.   Pain Assessment: Severity of Chronic pain is reported as a 9 /10. Location: Neck  /denies. Onset: More than a month ago. Quality: Tightness. Timing: Constant. Modifying factor(s): denies. Vitals:  height is 5' (1.524 m) and weight is 165 lb (74.8 kg). Her temporal temperature is 97.3 F (36.3 C) (abnormal). Her blood pressure is 157/69 (abnormal) and her pulse is 71. Her respiration is 14 and oxygen saturation is 98%.  BMI: Estimated body mass index is 32.22 kg/m as calculated from the following:   Height as of this encounter: 5' (1.524 m).   Weight as of this encounter: 165 lb (74.8 kg).  Last encounter: 11/17/2023. Last procedure: Visit date not found.  Reason for encounter: evaluation for possible interventional PM  therapy/treatment.  The patient continues experiencing bilateral neck pain radiating to the shoulders and arms.  Based on her symptoms and clinical findings, we discussed proceeding with a cervical epidural steroid injection.  Pharmacotherapy Assessment   Monitoring: [place] PMP: PDMP not reviewed this encounter.       Pharmacotherapy: No side-effects or adverse reactions reported. Compliance: No problems identified. Effectiveness: Clinically acceptable.  No notes on file  UDS:  Summary  Date Value Ref Range Status  12/15/2022 Note  Final    Comment:    ==================================================================== Compliance Drug Analysis, Ur ==================================================================== Test                             Result       Flag       Units  Drug Present and Declared for Prescription Verification   Tramadol                        >3521        EXPECTED   ng/mg creat   O-Desmethyltramadol            2561         EXPECTED   ng/mg creat   N-Desmethyltramadol            1020         EXPECTED   ng/mg creat    Source of tramadol  is a prescription medication. O-desmethyltramadol    and N-desmethyltramadol are expected metabolites of tramadol .    Duloxetine                     PRESENT  EXPECTED   Acetaminophen                  PRESENT      EXPECTED ==================================================================== Test                      Result    Flag   Units      Ref Range   Creatinine              142              mg/dL      >=79 ==================================================================== Declared Medications:  The flagging and interpretation on this report are based on the  following declared medications.  Unexpected results may arise from  inaccuracies in the declared medications.   **Note: The testing scope of this panel includes these medications:   Duloxetine (Cymbalta)  Tramadol  (Ultram )   **Note: The testing scope of this  panel does not include small to  moderate amounts of these reported medications:   Acetaminophen (Tylenol)   **Note: The testing scope of this panel does not include the  following reported medications:   Amlodipine (Norvasc)  Bisacodyl (Dulcolax)  Calcium  Famotidine (Pepcid)  Hydrochlorothiazide (Hydrodiuril)  Levothyroxine (Synthroid)  Losartan (Cozaar)  Lubiprostone (Amitiza)  Magnesium (Mag-Ox)  Melatonin  Omega-3 Fatty Acids  Omeprazole (Prilosec)  Supplement  Vitamin D ==================================================================== For clinical consultation, please call [phone]. ====================================================================     No results found for: CBDTHCR No results found for: D8THCCBX No results found for: D9THCCBX  ROS  Constitutional: Denies any fever or chills Gastrointestinal: No reported hemesis, hematochezia, vomiting, or acute GI distress Musculoskeletal: Neck pain Neurological: No reported episodes of acute onset apraxia, aphasia, dysarthria, agnosia, amnesia, paralysis, loss of coordination, or loss of consciousness  Medication Review  DULoxetine, Melatonin, Oyster Shell Calcium/D, acetaminophen, amLODipine, clobetasol cream, diphenhydrAMINE, hydrochlorothiazide, levothyroxine, loratadine, losartan, magnesium oxide, omega-3 fish oil, omeprazole, ondansetron, polyethylene glycol, sodium fluoride, tacrolimus, and traMADol   History Review  Allergy: Ms. Leet is allergic to other, sulfa antibiotics, amitriptyline, flagyl [metronidazole], gabapentin, penicillins, ciprofloxacin, and levofloxacin. Drug: Ms. Doswell  reports no history of drug use. Alcohol:  reports no history of alcohol use. Tobacco:  reports that she has never smoked. She has never used smokeless tobacco. Social: Ms. Clippard  reports that she has never smoked. She has never used smokeless tobacco. She reports that she does not drink alcohol and  does not use drugs. Medical:  has a past medical history of Arthritis, Diverticulitis, GERD (gastroesophageal reflux disease), Hypertension, and Thyroid disease. Surgical: Ms. Kalb  has a past surgical history that includes Joint replacement; Colon surgery; Hernia repair; Cholecystectomy; Cataract extraction (Bilateral); Rotator cuff repair (Right); and Excision Morton's neuroma (Left). Family: family history is not on file.  Laboratory Chemistry Profile   Renal Lab Results  Component Value Date   BUN 29 (H) 11/24/2016   CREATININE 0.97 11/24/2016   GFRAA >60 11/24/2016   GFRNONAA 53 (L) 11/24/2016    Hepatic No results found for: AST, ALT, ALBUMIN, ALKPHOS, HCVAB, AMYLASE, LIPASE, AMMONIA  Electrolytes Lab Results  Component Value Date   NA 139 11/24/2016   K 3.2 (L) 11/24/2016   CL 104 11/24/2016   CALCIUM 8.7 (L) 11/24/2016    Bone No results found for: VD25OH, [id number], CI6874NY7, CI7874NY7, 25OHVITD1, 25OHVITD2, 25OHVITD3, TESTOFREE, TESTOSTERONE  Inflammation (CRP: Acute Phase) (ESR: Chronic Phase) No results found for: CRP, ESRSEDRATE, LATICACIDVEN       Note: Above Lab results  reviewed.  Recent Imaging Review  DG Foot Complete Left Please see detailed radiograph report in office note. Note: Reviewed        Physical Exam  Vitals: BP (!) 157/69 (Cuff Size: Normal)   Pulse 71   Temp (!) 97.3 F (36.3 C) (Temporal)   Resp 14   Ht 5' (1.524 m)   Wt 165 lb (74.8 kg)   SpO2 98%   BMI 32.22 kg/m  BMI: Estimated body mass index is 32.22 kg/m as calculated from the following:   Height as of this encounter: 5' (1.524 m).   Weight as of this encounter: 165 lb (74.8 kg). Ideal: Ideal body weight: 45.5 kg (100 lb 4.9 oz) Adjusted ideal body weight: 57.2 kg (126 lb 3 oz) General appearance: Well nourished, well developed, and well hydrated. In no apparent acute distress Mental status: Alert, oriented x 3 (person, place, &  time)       Respiratory: No evidence of acute respiratory distress Eyes: PERLA  Musculoskeletal: + neck pain, reduced ROM  Cervical Spine Exam  Skin & Axial Inspection: No masses, redness, edema, swelling, or associated skin lesions Alignment: Symmetrical Functional ROM: Pain restricted ROM, bilaterally Stability: No instability detected Muscle Tone/Strength: Functionally intact. No obvious neuro-muscular anomalies detected. Sensory (Neurological): Musculoskeletal pain pattern Palpation: No palpable anomalies              Upper Extremity (UE) Exam      Right  Left  Inspection    Skin color, temperature, and hair growth are WNL. No peripheral edema or cyanosis. No masses, redness, swelling, asymmetry, or associated skin lesions. No contractures.  Skin color, temperature, and hair growth are WNL. No peripheral edema or cyanosis. No masses, redness, swelling, asymmetry, or associated skin lesions. No contractures.          Functional ROM    Decreased ROM          Decreased ROM                  Muscle Tone/Strength    Functionally intact. No obvious neuro-muscular anomalies detected.  Functionally intact. No obvious neuro-muscular anomalies detected.          Sensory (Neurological)    Musculoskeletal pain pattern          Musculoskeletal pain pattern                  Palpation    No palpable anomalies              No palpable anomalies                      Maneuver Shoulder abduction (deltoid/supraspinatus, axillary/supra scapular n,, C5) Elbow flexion (biceps brachial, musculoskeletal n, C5-6) Elbow extension (triceps, radial n, C7) Finger abduction (interossei, ulnar n, T1)    Shoulder abduction (deltoid/supraspinatus, axillary/supra scapular n,, C5) Elbow flexion (biceps brachial, musculoskeletal n, C5-6) Elbow extension (triceps, radial n, C7) Wrist extensors (C6) Finger extensors (C8) Finger abduction (interossei, ulnar n, T1)            Provocative Test    Phalen's test:  deferred Tinel's test: deferred Apley's scratch test (touch opposite shoulder):  Action 1 (Across chest): deferred Action 2 (Overhead): deferred Action 3 (LB reach): deferred  Phalen's test: deferred Tinel's test: deferred Apley's scratch test (touch opposite shoulder):  Action 1 (Across chest): deferred Action 2 (Overhead): deferred Action 3 (LB reach): deferred  Level  Myotome  Dermatome  Sclerotome  ROM  C5  Elbow flexion  Lateral upper arm      C6  Wrist extension  Thumb and index      C7  Elbow extension  Middle finger      C8  Finger extension  Ring and pinky finger      T1  Finger abduction  Medial elbow and axilla                                                                                                                                         Assessment   Diagnosis Status  1. Cervicalgia   2. Cervical spine arthritis with nerve pain   3. Chronic pain syndrome   4. Chronic radicular lumbar pain   5. Lumbar facet arthropathy    Persistent Persistent Persistent   Updated Problems: No problems updated.  Plan of Care  Problem-specific:  Assessment and Plan  Cervical spine arthritis with nerve pain: The patient continues experiencing bilateral neck pain radiating to the shoulders and arms.  Her recent CT scan showed Chronic degenerative spondylosis and facet osteoarthritis.  Foraminal narrowing left worse than right at C3-4 could possibly cause neural compression.  Based on her symptoms, clinical findings, and CT of cervical spine, we discussed cervical epidural injection for pain relief and functional improvement with range of motion.  Plan: (Clinic): (B) C-ESI # 1 with Dr. Marcelino   Ms. Jakki Shook Keenum has a current medication list which includes the following long-term medication(s): amlodipine, oyster shell calcium/d, diphenhydramine, duloxetine, hydrochlorothiazide, levothyroxine, loratadine, and losartan.  Pharmacotherapy  (Medications Ordered): No orders of the defined types were placed in this encounter.  Orders:  Orders Placed This Encounter  Procedures   Cervical Epidural Injection    Sedation: Patient's choice. Purpose: Diagnostic/Therapeutic Indication(s): Radiculitis and cervicalgia associater with cervical degenerative disc disease.    Standing Status:   Future    Expiration Date:   03/20/2024    Scheduling Instructions:     Procedure: Cervical Epidural Steroid Injection/Block     Level(s): C7-T1     Laterality: Midline     Timeframe: As soon as schedule allows.    Where will this procedure be performed?:   [facility]             by Dr. Tanya        Return in about 4 weeks (around 01/16/2024) for (Clinic): (B) C-ESI # 1 with Dr. Marcelino .    Recent Visits Date Type Provider Dept  11/17/23 Office Visit Aziel Morgan K, NP [facility]  Showing recent visits within past 90 days and meeting all other requirements Today's Visits Date Type Provider Dept  12/19/23 Office Visit Briya Lookabaugh K, NP [facility]  Showing today's visits and meeting all other requirements Future Appointments Date Type Provider Dept  01/11/24 Appointment Marcelino Nurse,  MD [facility]  02/15/24 Appointment Alahni Varone K, NP [facility]  Showing future appointments within next 90 days and meeting all other requirements  I discussed the assessment and treatment plan with the patient. The patient was provided an opportunity to ask questions and all were answered. The patient agreed with the plan and demonstrated an understanding of the instructions.  Patient advised to call back or seek an in-person evaluation if the symptoms or condition worsens.  Duration of encounter: 20 minutes.  Total time on encounter, as per AMA guidelines included both the face-to-face and non-face-to-face time personally spent by the physician and/or other qualified health care professional(s) on  the day of the encounter (includes time in activities that require the physician or other qualified health care professional and does not include time in activities normally performed by clinical staff). Physician's time may include the following activities when performed: Preparing to see the patient (e.g., pre-charting review of records, searching for previously ordered imaging, lab work, and nerve conduction tests) Review of prior analgesic pharmacotherapies. Reviewing PMP Interpreting ordered tests (e.g., lab work, imaging, nerve conduction tests) Performing post-procedure evaluations, including interpretation of diagnostic procedures Obtaining and/or reviewing separately obtained history Performing a medically appropriate examination and/or evaluation Counseling and educating the patient/family/caregiver Ordering medications, tests, or procedures Referring and communicating with other health care professionals (when not separately reported) Documenting clinical information in the electronic or other health record Independently interpreting results (not separately reported) and communicating results to the patient/ family/caregiver Care coordination (not separately reported)  Note by: Zaydan Papesh K Chairty Toman, NP (TTS and AI technology used. I apologize for any typographical errors that were not detected and corrected.) Date: 12/19/2023; Time: 4:29 PM

## 2023-12-19 NOTE — Patient Instructions (Addendum)
 ______________________________________________________________________    General Risks and Possible Complications  Patient Responsibilities: It is important that you read this as it is part of your informed consent. It is our duty to inform you of the risks and possible complications associated with treatments offered to you. It is your responsibility as a patient to read this and to ask questions about anything that is not clear or that you believe was not covered in this document.  Patient's Rights: You have the right to refuse treatment. You also have the right to change your mind, even after initially having agreed to have the treatment done. However, under this last option, if you wait until the last second to change your mind, you may be charged for the materials used up to that point.  Introduction: Medicine is not an Visual merchandiser. Everything in Medicine, including the lack of treatment(s), carries the potential for danger, harm, or loss (which is by definition: Risk). In Medicine, a complication is a secondary problem, condition, or disease that can aggravate an already existing one. All treatments carry the risk of possible complications. The fact that a side effects or complications occurs, does not imply that the treatment was conducted incorrectly. It must be clearly understood that these can happen even when everything is done following the highest safety standards.  No treatment: You can choose not to proceed with the proposed treatment alternative. The "PRO(s)" would include: avoiding the risk of complications associated with the therapy. The "CON(s)" would include: not getting any of the treatment benefits. These benefits fall under one of three categories: diagnostic; therapeutic; and/or palliative. Diagnostic benefits include: getting information which can ultimately lead to improvement of the disease or symptom(s). Therapeutic benefits are those associated with the successful  treatment of the disease. Finally, palliative benefits are those related to the decrease of the primary symptoms, without necessarily curing the condition (example: decreasing the pain from a flare-up of a chronic condition, such as incurable terminal cancer).  General Risks and Complications: These are associated to most interventional treatments. They can occur alone, or in combination. They fall under one of the following six (6) categories: no benefit or worsening of symptoms; bleeding; infection; nerve damage; allergic reactions; and/or death. No benefits or worsening of symptoms: In Medicine there are no guarantees, only probabilities. No healthcare provider can ever guarantee that a medical treatment will work, they can only state the probability that it may. Furthermore, there is always the possibility that the condition may worsen, either directly, or indirectly, as a consequence of the treatment. Bleeding: This is more common if the patient is taking a blood thinner, either prescription or over the counter (example: Goody Powders, Fish oil, Aspirin, Garlic, etc.), or if suffering a condition associated with impaired coagulation (example: Hemophilia, cirrhosis of the liver, low platelet counts, etc.). However, even if you do not have one on these, it can still happen. If you have any of these conditions, or take one of these drugs, make sure to notify your treating physician. Infection: This is more common in patients with a compromised immune system, either due to disease (example: diabetes, cancer, human immunodeficiency virus [HIV], etc.), or due to medications or treatments (example: therapies used to treat cancer and rheumatological diseases). However, even if you do not have one on these, it can still happen. If you have any of these conditions, or take one of these drugs, make sure to notify your treating physician. Nerve Damage: This is more common when the treatment is  an invasive one, but it  can also happen with the use of medications, such as those used in the treatment of cancer. The damage can occur to small secondary nerves, or to large primary ones, such as those in the spinal cord and brain. This damage may be temporary or permanent and it may lead to impairments that can range from temporary numbness to permanent paralysis and/or brain death. Allergic Reactions: Any time a substance or material comes in contact with our body, there is the possibility of an allergic reaction. These can range from a mild skin rash (contact dermatitis) to a severe systemic reaction (anaphylactic reaction), which can result in death. Death: In general, any medical intervention can result in death, most of the time due to an unforeseen complication. ______________________________________________________________________     Epidural Steroid Injection Patient Information  Description: The epidural space surrounds the nerves as they exit the spinal cord.  In some patients, the nerves can be compressed and inflamed by a bulging disc or a tight spinal canal (spinal stenosis).  By injecting steroids into the epidural space, we can bring irritated nerves into direct contact with a potentially helpful medication.  These steroids act directly on the irritated nerves and can reduce swelling and inflammation which often leads to decreased pain.  Epidural steroids may be injected anywhere along the spine and from the neck to the low back depending upon the location of your pain.   After numbing the skin with local anesthetic (like Novocaine), a small needle is passed into the epidural space slowly.  You may experience a sensation of pressure while this is being done.  The entire block usually last less than 10 minutes.  Conditions which may be treated by epidural steroids:  Low back and leg pain Neck and arm pain Spinal stenosis Post-laminectomy syndrome Herpes zoster (shingles) pain Pain from compression  fractures  Preparation for the injection:  Do not eat any solid food or dairy products within 8 hours of your appointment.  You may drink clear liquids up to 3 hours before appointment.  Clear liquids include water, black coffee, juice or soda.  No milk or cream please. You may take your regular medication, including pain medications, with a sip of water before your appointment  Diabetics should hold regular insulin (if taken separately) and take 1/2 normal NPH dos the morning of the procedure.  Carry some sugar containing items with you to your appointment. A driver must accompany you and be prepared to drive you home after your procedure.  Bring all your current medications with your. An IV may be inserted and sedation may be given at the discretion of the physician.   A blood pressure cuff, EKG and other monitors will often be applied during the procedure.  Some patients may need to have extra oxygen administered for a short period. You will be asked to provide medical information, including your allergies, prior to the procedure.  We must know immediately if you are taking blood thinners (like Coumadin/Warfarin)  Or if you are allergic to IV iodine contrast (dye). We must know if you could possible be pregnant.  Possible side-effects: Bleeding from needle site Infection (rare, may require surgery) Nerve injury (rare) Numbness & tingling (temporary) Difficulty urinating (rare, temporary) Spinal headache ( a headache worse with upright posture) Light -headedness (temporary) Pain at injection site (several days) Decreased blood pressure (temporary) Weakness in arm/leg (temporary) Pressure sensation in back/neck (temporary)  Call if you experience: Fever/chills associated with headache  or increased back/neck pain. Headache worsened by an upright position. New onset weakness or numbness of an extremity below the injection site Hives or difficulty breathing (go to the emergency  room) Inflammation or drainage at the infection site Severe back/neck pain Any new symptoms which are concerning to you  Please note:  Although the local anesthetic injected can often make your back or neck feel good for several hours after the injection, the pain will likely return.  It takes 3-7 days for steroids to work in the epidural space.  You may not notice any pain relief for at least that one week.  If effective, we will often do a series of three injections spaced 3-6 weeks apart to maximally decrease your pain.  After the initial series, we generally will wait several months before considering a repeat injection of the same type.  If you have any questions, please call [phone]  [facility] ______________________________________________________________________    Procedure instructions  Stop blood-thinners  Do not eat or drink fluids (other than water) for 6 hours before your procedure  No water for 2 hours before your procedure  Take your blood pressure medicine with a sip of water  Arrive 30 minutes before your appointment  If sedation is planned, bring suitable driver. [place], [place], & public transportation are NOT APPROVED)  Carefully read the Preparing for your procedure detailed instructions  If you have questions call us  at [phone]  Procedure appointments are for procedures only.   NO medication refills or new problem evaluations will be done on procedure days.   Only the scheduled, pre-approved procedure and side will be done.   ______________________________________________________________________     ______________________________________________________________________    Preparing for your procedure  Appointments: If you think you may not be able to keep your appointment, call 24-48 hours in advance to cancel. We need time to make it available to others.  Procedure visits are for procedures only.  During your procedure appointment there will be: NO Prescription Refills*. NO medication changes or discussions*. NO discussion of disability issues*. NO unrelated pain problem evaluations*. NO evaluations to order other pain procedures*. *These will be addressed at a separate and distinct evaluation encounter on the provider's evaluation schedule and not during procedure days.  Instructions: Food intake: Avoid eating anything solid for at least 8 hours prior to your procedure. Clear liquid intake: You may take clear liquids such as water up to 2 hours prior to your procedure. (No carbonated drinks. No soda.) Transportation: Unless otherwise stated by your physician, bring a driver. (Driver cannot be a Market researcher, Pharmacist, community, or any other form of public transportation.) Morning Medicines: Except for blood thinners, take all of your other morning medications with a sip of water. Make sure to take your heart and blood pressure medicines. If your blood pressure's lower number is above 100, the case will be rescheduled. Blood thinners: Make sure to stop your blood thinners as instructed.  If you take a blood thinner, but were not instructed to stop it, call our office [phone] and ask to talk to a nurse. Not stopping a blood thinner prior to certain procedures could lead to serious complications. Diabetics on insulin: Notify the staff so that you can be scheduled 1st case in the morning. If your diabetes requires high dose insulin, take only  of your normal insulin dose the morning of the procedure and notify the staff that you have done so. Preventing infections: Shower with an antibacterial  soap the morning of your procedure.  Build-up your immune system: Take 1000 mg of Vitamin C with every meal (3 times a day) the day prior to your procedure. Antibiotics: Inform the nursing staff if you are taking any antibiotics or if you have any conditions that may require antibiotics prior to procedures. (Example:  recent joint implants)   Pregnancy: If you are pregnant make sure to notify the nursing staff. Not doing so may result in injury to the fetus, including death.  Sickness: If you have a cold, fever, or any active infections, call and cancel or reschedule your procedure. Receiving steroids while having an infection may result in complications. Arrival: You must be in the facility at least 30 minutes prior to your scheduled procedure. Tardiness: Your scheduled time is also the cutoff time. If you do not arrive at least 15 minutes prior to your procedure, you will be rescheduled.  Children: Do not bring any children with you. Make arrangements to keep them home. Dress appropriately: There is always a possibility that your clothing may get soiled. Avoid long dresses. Valuables: Do not bring any jewelry or valuables.  Reasons to call and reschedule or cancel your procedure: (Following these recommendations will minimize the risk of a serious complication.) Surgeries: Avoid having procedures within 2 weeks of any surgery. (Avoid for 2 weeks before or after any surgery). Flu Shots: Avoid having procedures within 2 weeks of a flu shots or . (Avoid for 2 weeks before or after immunizations). Barium: Avoid having a procedure within 7-10 days after having had a radiological study involving the use of radiological contrast. (Myelograms, Barium swallow or enema study). Heart attacks: Avoid any elective procedures or surgeries for the initial 6 months after a Myocardial Infarction (Heart Attack). Blood thinners: It is imperative that you stop these medications before procedures. Let us  know if you if you take any blood thinner.  Infection: Avoid procedures during or within two weeks of an infection (including chest colds or gastrointestinal problems). Symptoms associated with infections include: Localized redness, fever, chills, night sweats or profuse sweating, burning sensation when voiding, cough, congestion,  stuffiness, runny nose, sore throat, diarrhea, nausea, vomiting, cold or Flu symptoms, recent or current infections. It is specially important if the infection is over the area that we intend to treat. Heart and lung problems: Symptoms that may suggest an active cardiopulmonary problem include: cough, chest pain, breathing difficulties or shortness of breath, dizziness, ankle swelling, uncontrolled high or unusually low blood pressure, and/or palpitations. If you are experiencing any of these symptoms, cancel your procedure and contact your primary care physician for an evaluation.  Remember:  Regular Business hours are:  Monday to Thursday 8:00 AM to 4:00 PM  Provider's Schedule: Eric Como, MD:  Procedure days: Tuesday and Thursday 7:30 AM to 4:00 PM  Wallie Sherry, MD:  Procedure days: Monday and Wednesday 7:30 AM to 4:00 PM Last  Updated: 02/22/2023 ______________________________________________________________________     ______________________________________________________________________    Blood Thinners  IMPORTANT NOTICE:  If you take any of these, make sure to notify the nursing staff.  Failure to do so may result in serious injury.  Recommended time intervals to stop and restart blood-thinners, before & after invasive procedures  Generic Name Brand Name Pre-procedure: Stop medication for this amount of time before your procedure: Post-procedure: Wait this amount of time after the procedure before restarting your medication:  Abciximab Reopro 15 days 2 hrs  Alteplase Activase 10 days 10 days  Anagrelide Agrylin  Apixaban Eliquis 3 days 6 hrs  Cilostazol Pletal 3 days 5 hrs  Clopidogrel Plavix 7-10 days 2 hrs  Dabigatran Pradaxa 5 days 6 hrs  Dalteparin Fragmin 24 hours 4 hrs  Dipyridamole Aggrenox 11days 2 hrs  Edoxaban Lixiana; Savaysa 3 days 2 hrs  Enoxaparin  Lovenox 24 hours 4 hrs  Eptifibatide Integrillin 8 hours 2 hrs  Fondaparinux  Arixtra 72 hours  12 hrs  Hydroxychloroquine Plaquenil 11 days   Prasugrel Effient 7-10 days 6 hrs  Reteplase Retavase 10 days 10 days  Rivaroxaban Xarelto 3 days 6 hrs  Ticagrelor Brilinta 5-7 days 6 hrs  Ticlopidine Ticlid 10-14 days 2 hrs  Tinzaparin Innohep 24 hours 4 hrs  Tirofiban Aggrastat 8 hours 2 hrs  Warfarin Coumadin 5 days 2 hrs   Other medications with blood-thinning effects  NOTE: Consider stopping these if you have prolonged bleeding despite not taking any of the above blood thinners. Otherwise ask your provider and this will be decided on a case-by-case basis.  Product indications Generic (Brand) names Note  Cholesterol Lipitor Stop 4 days before procedure  Blood thinner (injectable) Heparin (LMW or LMWH Heparin) Stop 24 hours before procedure  Cancer Ibrutinib (Imbruvica) Stop 7 days before procedure  Malaria/Rheumatoid Hydroxychloroquine (Plaquenil) Stop 11 days before procedure  Thrombolytics  10 days before or after procedures   Over-the-counter (OTC) Products with blood-thinning effects  NOTE: Consider stopping these if you have prolonged bleeding despite not taking any of the above blood thinners. Otherwise ask your provider and this will be decided on a case-by-case basis.  Product Common names Stop Time  Aspirin > 325 mg Goody Powders, Excedrin, etc. 11 days  Aspirin <= 81 mg  7 days  Fish oil  4 days  Garlic supplements  7 days  Ginkgo biloba  36 hours  Ginseng  24 hours  NSAIDs Ibuprofen, Naprosyn, etc. 3 days  Vitamin E  4 days   ______________________________________________________________________

## 2024-01-11 ENCOUNTER — Encounter: Payer: Self-pay | Admitting: Student in an Organized Health Care Education/Training Program

## 2024-01-11 ENCOUNTER — Ambulatory Visit
Admission: RE | Admit: 2024-01-11 | Discharge: 2024-01-11 | Disposition: A | Discharge status: Home / Self Care | Source: Ambulatory Visit | Attending: Student in an Organized Health Care Education/Training Program | Admitting: Student in an Organized Health Care Education/Training Program

## 2024-01-11 ENCOUNTER — Ambulatory Visit (HOSPITAL_BASED_OUTPATIENT_CLINIC_OR_DEPARTMENT_OTHER): Admitting: Student in an Organized Health Care Education/Training Program

## 2024-01-11 VITALS — BP 144/74 | HR 65 | Temp 97.2°F | Resp 19 | Ht 60.0 in | Wt 165.0 lb

## 2024-01-11 DIAGNOSIS — M542 Cervicalgia: Secondary | ICD-10-CM

## 2024-01-11 DIAGNOSIS — M5412 Radiculopathy, cervical region: Secondary | ICD-10-CM | POA: Diagnosis present

## 2024-01-11 MED ORDER — ROPIVACAINE HCL 2 MG/ML IJ SOLN
1.0000 mL | Freq: Once | INTRAMUSCULAR | Status: AC
  Administered 2024-01-11: 1 mL via EPIDURAL
  Filled 2024-01-11: qty 20

## 2024-01-11 MED ORDER — SODIUM CHLORIDE (PF) 0.9 % IJ SOLN
INTRAMUSCULAR | Status: AC
  Filled 2024-01-11: qty 10

## 2024-01-11 MED ORDER — DEXAMETHASONE SOD PHOSPHATE PF 10 MG/ML IJ SOLN
10.0000 mg | Freq: Once | INTRAMUSCULAR | Status: AC
  Administered 2024-01-11: 10 mg

## 2024-01-11 MED ORDER — IOHEXOL 180 MG/ML  SOLN
INTRAMUSCULAR | Status: AC
  Filled 2024-01-11: qty 20

## 2024-01-11 MED ORDER — SODIUM CHLORIDE 0.9% FLUSH
1.0000 mL | Freq: Once | INTRAVENOUS | Status: AC
  Administered 2024-01-11: 1 mL

## 2024-01-11 MED ORDER — LIDOCAINE HCL 2 % IJ SOLN
20.0000 mL | Freq: Once | INTRAMUSCULAR | Status: AC
  Administered 2024-01-11: 200 mg
  Filled 2024-01-11: qty 40

## 2024-01-11 NOTE — Progress Notes (Signed)
 PROVIDER NOTE: Interpretation of information contained herein should be left to medically-trained personnel. Specific patient instructions are provided elsewhere under Patient Instructions section of medical record. This document was created in part using STT-dictation technology, any transcriptional errors that may result from this process are unintentional.  Patient: Andrea Blake Type: Established DOB: 03-11-1932 MRN: [id number] PCP: Alyse Bradley, MD  Service: Procedure DOS: 01/11/2024 Setting: Ambulatory Location: Ambulatory outpatient facility Delivery: Face-to-face Provider: Wallie Sherry, MD Specialty: Interventional Pain Management Specialty designation: 09 Location: Outpatient facility Ref. Prov.: Alyse Bradley, MD       Interventional Therapy   Type: Cervical Epidural Steroid injection (CESI) (Interlaminar) #1  Laterality: Midline  Level: C7-T1 DOS: 01/11/2024  Provider: Wallie Sherry, MD Imaging: Fluoroscopy-guided Spinal ([id number]) Anesthesia: Local anesthesia (1-2% Lidocaine )   Medical Necessity Purpose: Diagnostic/Therapeutic Rationale (medical necessity): procedure needed and proper for the diagnosis and/or treatment of Ms. [name]'s medical symptoms and needs. Indications: Cervicalgia, cervical radicular pain, degenerative disc disease, severe enough to impact quality of life or function. 1. Cervicalgia   2. Cervical radicular pain    NAS-11 Pain score:   Pre-procedure: 9 /10   Post-procedure: 0-No pain/10     Position  Prep  Materials:  Location setting: Procedure suite Position: Prone, on modified reverse trendelenburg to facilitate breathing, with head in head-cradle. Pillows positioned under chest (below chin-level) with cervical spine flexed. Safety Precautions: Patient was assessed for positional comfort and pressure points before starting the procedure. Prepping solution: DuraPrep (Iodine Povacrylex [0.7% available iodine] and Isopropyl  Alcohol, 74% w/w) Prep Area: Entire  cervicothoracic region Approach: percutaneous, paramedial Intended target: Posterior cervical epidural space Materials Procedure:  Tray: Epidural Needle(s): Epidural (Tuohy) Qty: 1 Length: (90mm) 3.5-inch Gauge: 22G  H&P (Pre-op Assessment):  Andrea Blake is a 89 y.o. (year old), female patient, seen today for interventional treatment. She  has a past surgical history that includes Joint replacement; Colon surgery; Hernia repair; Cholecystectomy; Cataract extraction (Bilateral); Rotator cuff repair (Right); and Excision Morton's neuroma (Left). Andrea Blake has a current medication list which includes the following prescription(s): acetaminophen, amlodipine, oyster shell calcium/d, clobetasol cream, diphenhydramine, hydrochlorothiazide, levothyroxine, loratadine, losartan, magnesium oxide, melatonin, omega-3 fish oil, omeprazole, ondansetron, polyethylene glycol, sodium fluoride, tacrolimus, tramadol , and duloxetine. Her primarily concern today is the Neck Pain  Initial Vital Signs:  Pulse/HCG Rate: 65ECG Heart Rate: 93 Temp: (!) 97.2 F (36.2 C) Resp: 14 BP: (!) 150/61 SpO2: 96 %  BMI: Estimated body mass index is 32.22 kg/m as calculated from the following:   Height as of this encounter: 5' (1.524 m).   Weight as of this encounter: 165 lb (74.8 kg).  Risk Assessment: Allergies: Reviewed. She is allergic to other, sulfa antibiotics, amitriptyline, flagyl [metronidazole], gabapentin, penicillins, ciprofloxacin, and levofloxacin.  Allergy Precautions: None required Coagulopathies: Reviewed. None identified.  Blood-thinner therapy: None at this time Active Infection(s): Reviewed. None identified. Andrea Blake is afebrile  Site Confirmation: Andrea Blake was asked to confirm the procedure and laterality before marking the site Procedure checklist: Completed Consent: Before the procedure and under the influence of no sedative(s), amnesic(s), or  anxiolytics, the patient was informed of the treatment options, risks and possible complications. To fulfill our ethical and legal obligations, as recommended by the American Medical Association's Code of Ethics, I have informed the patient of my clinical impression; the nature and purpose of the treatment or procedure; the risks, benefits, and possible complications of the intervention; the alternatives, including doing nothing; the risk(s) and benefit(s) of the alternative treatment(s)  or procedure(s); and the risk(s) and benefit(s) of doing nothing. The patient was provided information about the general risks and possible complications associated with the procedure. These may include, but are not limited to: failure to achieve desired goals, infection, bleeding, organ or nerve damage, allergic reactions, paralysis, and death. In addition, the patient was informed of those risks and complications associated to Spine-related procedures, such as failure to decrease pain; infection (i.e.: Meningitis, epidural or intraspinal abscess); bleeding (i.e.: epidural hematoma, subarachnoid hemorrhage, or any other type of intraspinal or peri-dural bleeding); organ or nerve damage (i.e.: Any type of peripheral nerve, nerve root, or spinal cord injury) with subsequent damage to sensory, motor, and/or autonomic systems, resulting in permanent pain, numbness, and/or weakness of one or several areas of the body; allergic reactions; (i.e.: anaphylactic reaction); and/or death. Furthermore, the patient was informed of those risks and complications associated with the medications. These include, but are not limited to: allergic reactions (i.e.: anaphylactic or anaphylactoid reaction(s)); adrenal axis suppression; blood sugar elevation that in diabetics may result in ketoacidosis or comma; water retention that in patients with history of congestive heart failure may result in shortness of breath, pulmonary edema, and decompensation  with resultant heart failure; weight gain; swelling or edema; medication-induced neural toxicity; particulate matter embolism and blood vessel occlusion with resultant organ, and/or nervous system infarction; and/or aseptic necrosis of one or more joints. Finally, the patient was informed that Medicine is not an exact science; therefore, there is also the possibility of unforeseen or unpredictable risks and/or possible complications that may result in a catastrophic outcome. The patient indicated having understood very clearly. We have given the patient no guarantees and we have made no promises. Enough time was given to the patient to ask questions, all of which were answered to the patient's satisfaction. Ms. Seaberg has indicated that she wanted to continue with the procedure. Attestation: I, the ordering provider, attest that I have discussed with the patient the benefits, risks, side-effects, alternatives, likelihood of achieving goals, and potential problems during recovery for the procedure that I have provided informed consent. Date  Time: 01/11/2024 10:24 AM  Pre-Procedure Preparation:  Monitoring: As per clinic protocol. Respiration, ETCO2, SpO2, BP, heart rate and rhythm monitor placed and checked for adequate function Safety Precautions: Patient was assessed for positional comfort and pressure points before starting the procedure. Time-out: I initiated and conducted the Time-out before starting the procedure, as per protocol. The patient was asked to participate by confirming the accuracy of the Time Out information. Verification of the correct person, site, and procedure were performed and confirmed by me, the nursing staff, and the patient. Time-out conducted as per Joint Commission's Universal Protocol (UP.01.01.01). Time: 1124 Start Time: 1124 hrs.  Description  Narrative of Procedure:          Rationale (medical necessity): procedure needed and proper for the diagnosis and/or  treatment of the patient's medical symptoms and needs. Start Time: 1124 hrs. Safety Precautions: Aspiration looking for blood return was conducted prior to all injections. At no point did we inject any substances, as a needle was being advanced. No attempts were made at seeking any paresthesias. Safe injection practices and needle disposal techniques used. Medications properly checked for expiration dates. SDV (single dose vial) medications used. Description of procedure: Protocol guidelines were followed. The patient was assisted into a comfortable position. The target area was identified and the area prepped in the usual manner. Skin & deeper tissues infiltrated with local anesthetic. Appropriate amount  of time allowed to pass for local anesthetics to take effect. Using fluoroscopic guidance, the epidural needle was introduced through the skin, ipsilateral to the reported pain, and advanced to the target area. Posterior laminar os was contacted and the needle walked caudad, until the lamina was cleared. The ligamentum flavum was engaged and the epidural space identified using "loss-of-resistance technique" with 2-3 ml of PF-NaCl (0.9% NSS), in a 5cc dedicated LOR syringe. (See Imaging guidance below for use of contrast details.) Once proper needle placement was secured, and negative aspiration confirmed, the solution was injected in intermittent fashion, asking for systemic symptoms every 0.5cc. The needles were then removed and the area cleansed, making sure to leave some of the prepping solution back to take advantage of its long term bactericidal properties.  Vitals:   01/11/24 1111 01/11/24 1116 01/11/24 1121 01/11/24 1126  BP: (!) 141/71 (!) 141/70 (!) 146/77 (!) 144/74  Pulse:      Resp: 16 15 16 19   Temp:      TempSrc:      SpO2: 96% 96% 96% 97%  Weight:      Height:         End Time: 1127 hrs.  Imaging Guidance (Spinal):          Type of Imaging Technique: Fluoroscopy Guidance  (Spinal) Indication(s): Fluoroscopy guidance for needle placement to enhance accuracy in procedures requiring precise needle localization for targeted delivery of medication in or near specific anatomical locations not easily accessible without such real-time imaging assistance. Exposure Time: Please see nurses notes. Contrast: Before injecting any contrast, we confirmed that the patient did not have an allergy to iodine, shellfish, or radiological contrast. Once satisfactory needle placement was completed at the desired level, radiological contrast was injected. Contrast injected under live fluoroscopy. No contrast complications. See chart for type and volume of contrast used. Fluoroscopic Guidance: I was personally present during the use of fluoroscopy. Tunnel Vision Technique used to obtain the best possible view of the target area. Parallax error corrected before commencing the procedure. Direction-depth-direction technique used to introduce the needle under continuous pulsed fluoroscopy. Once target was reached, antero-posterior, oblique, and lateral fluoroscopic projection used confirm needle placement in all planes. Images permanently stored in EMR. Interpretation: I personally interpreted the imaging intraoperatively. Adequate needle placement confirmed in multiple planes. Appropriate spread of contrast into desired area was observed. No evidence of afferent or efferent intravascular uptake. No intrathecal or subarachnoid spread observed. Permanent images saved into the patient's record.  Post-operative Assessment:  Post-procedure Vital Signs:  Pulse/HCG Rate: 6586 Temp: (!) 97.2 F (36.2 C) Resp: 19 BP: (!) 144/74 SpO2: 97 %  EBL: None  Complications: No immediate post-treatment complications observed by team, or reported by patient.  Note: The patient tolerated the entire procedure well. A repeat set of vitals were taken after the procedure and the patient was kept under observation  following institutional policy, for this type of procedure. Post-procedural neurological assessment was performed, showing return to baseline, prior to discharge. The patient was provided with post-procedure discharge instructions, including a section on how to identify potential problems. Should any problems arise concerning this procedure, the patient was given instructions to immediately contact us , at any time, without hesitation. In any case, we plan to contact the patient by telephone for a follow-up status report regarding this interventional procedure.  Comments:  No additional relevant information.  Plan of Care (POC)  Orders:  Orders Placed This Encounter  Procedures   DG PAIN CLINIC  C-ARM 1-60 MIN NO REPORT    Intraoperative interpretation by procedural physician at [facility].    Standing Status:   Standing    Number of Occurrences:   1    Reason for exam::   Assistance in needle guidance and placement for procedures requiring needle placement in or near specific anatomical locations not easily accessible without such assistance.    Tramadol  50 mg BID prn   Medications ordered for procedure: Meds ordered this encounter  Medications   lidocaine  (XYLOCAINE ) 2 % (with pres) injection 400 mg   ropivacaine  (PF) 2 mg/mL (0.2%) (NAROPIN ) injection 1 mL   sodium chloride  flush (NS) 0.9 % injection 1 mL   dexamethasone  (DECADRON ) injection 10 mg   Medications administered: We administered lidocaine , ropivacaine  (PF) 2 mg/mL (0.2%), sodium chloride  flush, and dexamethasone .  See the medical record for exact dosing, route, and time of administration.    BLF L3-5 11/17/22, B/L L3 and L4 TF ESI C-ESI 01/11/24       Follow-up plan:   Return for Keep sch. appt.     Recent Visits Date Type Provider Dept  12/19/23 Office Visit Patel, Seema K, NP [facility]  11/17/23 Office Visit Patel, Seema K, NP [facility]  Showing recent visits within past 90  days and meeting all other requirements Today's Visits Date Type Provider Dept  01/11/24 Procedure visit Marcelino Nurse, MD [facility]  Showing today's visits and meeting all other requirements Future Appointments Date Type Provider Dept  02/15/24 Appointment Patel, Seema K, NP [facility]  Showing future appointments within next 90 days and meeting all other requirements   Disposition: Discharge home  Discharge (Date  Time): 01/11/2024; 1133 hrs.   Primary Care Physician: Alyse Bradley, MD Location: [facility] Note by: Nurse Marcelino, MD (TTS technology used. I apologize for any typographical errors that were not detected and corrected.) Date: 01/11/2024; Time: 1:15 PM  Disclaimer:  Medicine is not an visual merchandiser. The only guarantee in medicine is that nothing is guaranteed. It is important to note that the decision to proceed with this intervention was based on the information collected from the patient. The Data and conclusions were drawn from the patient's questionnaire, the interview, and the physical examination. Because the information was provided in large part by the patient, it cannot be guaranteed that it has not been purposely or unconsciously manipulated. Every effort has been made to obtain as much relevant data as possible for this evaluation. It is important to note that the conclusions that lead to this procedure are derived in large part from the available data. Always take into account that the treatment will also be dependent on availability of resources and existing treatment guidelines, considered by other Pain Management Practitioners as being common knowledge and practice, at the time of the intervention. For Medico-Legal purposes, it is also important to point out that variation in procedural techniques and pharmacological choices are the acceptable norm. The indications, contraindications, technique, and results of the  above procedure should only be interpreted and judged by a Board-Certified Interventional Pain Specialist with extensive familiarity and expertise in the same exact procedure and technique.

## 2024-01-11 NOTE — Patient Instructions (Signed)

## 2024-01-12 ENCOUNTER — Telehealth: Payer: Self-pay

## 2024-01-12 NOTE — Telephone Encounter (Signed)
 Post procedure follow up.  Patient states he is doing fine

## 2024-02-14 DIAGNOSIS — M542 Cervicalgia: Secondary | ICD-10-CM | POA: Insufficient documentation

## 2024-02-14 DIAGNOSIS — Z79899 Other long term (current) drug therapy: Secondary | ICD-10-CM | POA: Insufficient documentation

## 2024-02-15 ENCOUNTER — Encounter: Payer: Self-pay | Admitting: Nurse Practitioner

## 2024-02-15 ENCOUNTER — Ambulatory Visit: Attending: Nurse Practitioner | Admitting: Nurse Practitioner

## 2024-02-15 VITALS — BP 153/71 | HR 63 | Temp 97.0°F | Ht 60.0 in | Wt 165.0 lb

## 2024-02-15 DIAGNOSIS — M5412 Radiculopathy, cervical region: Secondary | ICD-10-CM | POA: Insufficient documentation

## 2024-02-15 DIAGNOSIS — M48062 Spinal stenosis, lumbar region with neurogenic claudication: Secondary | ICD-10-CM | POA: Diagnosis present

## 2024-02-15 DIAGNOSIS — G894 Chronic pain syndrome: Secondary | ICD-10-CM | POA: Insufficient documentation

## 2024-02-15 DIAGNOSIS — M51369 Other intervertebral disc degeneration, lumbar region without mention of lumbar back pain or lower extremity pain: Secondary | ICD-10-CM | POA: Diagnosis present

## 2024-02-15 DIAGNOSIS — M5416 Radiculopathy, lumbar region: Secondary | ICD-10-CM | POA: Insufficient documentation

## 2024-02-15 DIAGNOSIS — Z79899 Other long term (current) drug therapy: Secondary | ICD-10-CM | POA: Insufficient documentation

## 2024-02-15 DIAGNOSIS — M542 Cervicalgia: Secondary | ICD-10-CM | POA: Diagnosis present

## 2024-02-15 DIAGNOSIS — M47816 Spondylosis without myelopathy or radiculopathy, lumbar region: Secondary | ICD-10-CM | POA: Diagnosis present

## 2024-02-15 DIAGNOSIS — G8929 Other chronic pain: Secondary | ICD-10-CM | POA: Diagnosis present

## 2024-02-15 MED ORDER — TRAMADOL HCL 50 MG PO TABS: 50.0000 mg | ORAL_TABLET | Freq: Three times a day (TID) | ORAL | 2 refills | Status: AC | PRN

## 2024-02-15 NOTE — Progress Notes (Signed)
 Nursing Pain Medication Assessment:  Safety precautions to be maintained throughout the outpatient stay will include: orient to surroundings, keep bed in low position, maintain call bell within reach at all times, provide assistance with transfer out of bed and ambulation.  Medication Inspection Compliance: Andrea Blake did not comply with our request to bring her pills to be counted. She was reminded that bringing the medication bottles, even when empty, is a requirement.  Medication: None brought in. Pill/Patch Count: None available to be counted. Bottle Appearance: No container available. Did not bring bottle(s) to appointment. Filled Date: N/A Last Medication intake:  Today  Reminded patient to bring to all appointments.

## 2024-02-15 NOTE — Progress Notes (Signed)
 PROVIDER NOTE: Interpretation of information contained herein should be left to medically-trained personnel. Specific patient instructions are provided elsewhere under Patient Instructions section of medical record. This document was created in part using AI and STT-dictation technology, any transcriptional errors that may result from this process are unintentional.  Patient: Andrea Blake  Service: E/M   PCP: Alyse Bradley, MD  DOB: Aug 16, 1931  DOS: 02/15/2024  Provider: Emmy MARLA Blanch, NP  MRN: [id number]  Delivery: Face-to-face  Specialty: Interventional Pain Management  Type: Established Patient  Setting: Ambulatory outpatient facility  Specialty designation: 09  Referring Prov.: Alyse Bradley, MD  Location: Outpatient office facility       History of present illness (HPI) Andrea Blake, a 89 y.o. year old female, is here today because of her Cervicalgia [M54.2]. [name] primary complain today is Neck Pain, low back pain.  Pertinent problems: Andrea Blake has Lumbar facet arthropathy; Spinal stenosis, lumbar region, with neurogenic claudication; Lumbar degenerative disc disease; Chronic pain syndrome; Chronic radicular lumbar pain; Cervicalgia; Medication management; and Cervical radicular pain on their pertinent problem list.  Pain Assessment: Severity of Chronic pain is reported as a 0-No pain/10. Location: Back  /Denies. Onset: More than a month ago. Quality:  . Timing: Intermittent. Modifying factor(s): Sitting. Vitals:  height is 5' (1.524 m) and weight is 165 lb (74.8 kg). Her temporal temperature is 97 F (36.1 C) (abnormal). Her blood pressure is 153/71 (abnormal) and her pulse is 63. Her oxygen saturation is 96%.  BMI: Estimated body mass index is 32.22 kg/m as calculated from the following:   Height as of this encounter: 5' (1.524 m).   Weight as of this encounter: 165 lb (74.8 kg).  Last encounter: 12/19/2023. Last procedure: Visit date not  found.  Reason for encounter: both, medication management and post-procedure evaluation and assessment. No change in medical history since last visit.  Patient's pain is at baseline.  Patient continues multimodal pain regimen as prescribed.  States that it provides pain relief and improvement in functional status.   Andrea Blake underwent a diagnostic/therapeutic cervical epidural injection (CESI) January 11, 2024.  She reports initially 100% pain relief and functional improvement during local anesthetic phase, followed by sustained 100% pain relief and functional improvement since the procedure.   Discussed the use of AI scribe software for clinical note transcription with the patient, who gave verbal consent to proceed.  History of Present Illness   Andrea Blake is an 89 year old female who presents for pain management of chronic back pain.  She experiences persistent and significant back pain, particularly when standing or moving for extended periods. The pain becomes severe enough that she feels compelled to sit down to alleviate it. She describes the pain as telling her to 'go sit down'.  She inquires about adjusting her medication regimen, specifically asking if she can take two tablets of Tramadol  on particularly busy days. She recently refilled her prescription and notes that it currently has no refills.  She has previously undergone a lumbar facet block, which did not provide relief similar to a neck procedure she had.  She has experienced nausea, which she attributes to a course of fluconazole prescribed by her dermatologist for itchy skin. The nausea stopped when she stopped taking the medication. She also notes that severe pain can induce nausea, which she finds more distressing than the pain itself.  No side effects from her current pain medication.     Procedure Type: Cervical Epidural Steroid  injection (CESI) (Interlaminar) #1  Laterality: Midline  Level: C7-T1 DOS:  01/11/2024  Provider: Wallie Sherry, MD Imaging: Fluoroscopy-guided Spinal ([id number]) Anesthesia: Local anesthesia (1-2% Lidocaine )     Medical Necessity Purpose: Diagnostic/Therapeutic Rationale (medical necessity): procedure needed and proper for the diagnosis and/or treatment of Andrea Blake's medical symptoms and needs. Indications: Cervicalgia, cervical radicular pain, degenerative disc disease, severe enough to impact quality of life or function. 1. Cervicalgia   2. Cervical radicular pain     NAS-11 Pain score:        Pre-procedure: 9 /10        Post-procedure: 0-No pain/10   Post-Procedure Evaluation    Effectiveness:  Initial hour after procedure: 100 % . Subsequent 4-6 hours post-procedure: 100 % . Analgesia past initial 6 hours: 100 % . Ongoing improvement:  Analgesic:  100% Function: Andrea Blake reports improvement in function ROM: Andrea Blake reports improvement in ROM Interpretation: Andrea Blake underwent a diagnostic/therapeutic cervical epidural injection (CESI) January 11, 2024.  She reports initially 100% pain relief and functional improvement during local anesthetic phase, followed by sustained 100% pain relief and functional improvement since the procedure.  Pharmacotherapy Assessment   Monitoring: [place] PMP: PDMP reviewed during this encounter.       Pharmacotherapy: No side-effects or adverse reactions reported. Compliance: No problems identified. Effectiveness: Clinically acceptable.  Andrea Blake, [place]  02/15/2024 10:12 AM  Sign when Signing Visit Nursing Pain Medication Assessment:  Safety precautions to be maintained throughout the outpatient stay will include: orient to surroundings, keep bed in low position, maintain call bell within reach at all times, provide assistance with transfer out of bed and ambulation.  Medication Inspection Compliance: Ms. Schweers did not comply with our request to bring her pills to be counted. She was reminded that  bringing the medication bottles, even when empty, is a requirement.  Medication: None brought in. Pill/Patch Count: None available to be counted. Bottle Appearance: No container available. Did not bring bottle(s) to appointment. Filled Date: N/A Last Medication intake:  Today  Reminded patient to bring to all appointments.     UDS:  Summary  Date Value Ref Range Status  12/15/2022 Note  Final    Comment:    ==================================================================== Compliance Drug Analysis, Ur ==================================================================== Test                             Result       Flag       Units  Drug Present and Declared for Prescription Verification   Tramadol                        >3521        EXPECTED   ng/mg creat   O-Desmethyltramadol            2561         EXPECTED   ng/mg creat   N-Desmethyltramadol            1020         EXPECTED   ng/mg creat    Source of tramadol  is a prescription medication. O-desmethyltramadol    and N-desmethyltramadol are expected metabolites of tramadol .    Duloxetine                     PRESENT      EXPECTED   Acetaminophen  PRESENT      EXPECTED ==================================================================== Test                      Result    Flag   Units      Ref Range   Creatinine              142              mg/dL      >=79 ==================================================================== Declared Medications:  The flagging and interpretation on this report are based on the  following declared medications.  Unexpected results may arise from  inaccuracies in the declared medications.   **Note: The testing scope of this panel includes these medications:   Duloxetine (Cymbalta)  Tramadol  (Ultram )   **Note: The testing scope of this panel does not include small to  moderate amounts of these reported medications:   Acetaminophen (Tylenol)   **Note: The testing scope of  this panel does not include the  following reported medications:   Amlodipine (Norvasc)  Bisacodyl (Dulcolax)  Calcium  Famotidine (Pepcid)  Hydrochlorothiazide (Hydrodiuril)  Levothyroxine (Synthroid)  Losartan (Cozaar)  Lubiprostone (Amitiza)  Magnesium (Mag-Ox)  Melatonin  Omega-3 Fatty Acids  Omeprazole (Prilosec)  Supplement  Vitamin D ==================================================================== For clinical consultation, please call [phone]. ====================================================================     No results found for: CBDTHCR No results found for: D8THCCBX No results found for: D9THCCBX  ROS  Constitutional: Denies any fever or chills Gastrointestinal: No reported hemesis, hematochezia, vomiting, or acute GI distress Musculoskeletal: Neck pain, low back pain Neurological: No reported episodes of acute onset apraxia, aphasia, dysarthria, agnosia, amnesia, paralysis, loss of coordination, or loss of consciousness  Medication Review  DULoxetine, Melatonin, Oyster Shell Calcium/D, acetaminophen, amLODipine, clobetasol cream, diphenhydrAMINE, hydrochlorothiazide, levothyroxine, loratadine, losartan, magnesium oxide, omega-3 fish oil, omeprazole, ondansetron, polyethylene glycol, sodium fluoride, tacrolimus, and traMADol   History Review  Allergy: Ms. Heaslip is allergic to other, sulfa antibiotics, amitriptyline, flagyl [metronidazole], gabapentin, penicillins, ciprofloxacin, and levofloxacin. Drug: Ms. Friedt  reports no history of drug use. Alcohol:  reports no history of alcohol use. Tobacco:  reports that she has never smoked. She has never used smokeless tobacco. Social: Ms. Twaddell  reports that she has never smoked. She has never used smokeless tobacco. She reports that she does not drink alcohol and does not use drugs. Medical:  has a past medical history of Arthritis, Diverticulitis, GERD (gastroesophageal reflux disease),  Hypertension, and Thyroid disease. Surgical: Ms. Azpeitia  has a past surgical history that includes Joint replacement; Colon surgery; Hernia repair; Cholecystectomy; Cataract extraction (Bilateral); Rotator cuff repair (Right); and Excision Morton's neuroma (Left). Family: family history is not on file.  Laboratory Chemistry Profile   Renal Lab Results  Component Value Date   BUN 29 (H) 11/24/2016   CREATININE 0.97 11/24/2016   GFRAA >60 11/24/2016   GFRNONAA 53 (L) 11/24/2016    Hepatic No results found for: AST, ALT, ALBUMIN, ALKPHOS, HCVAB, AMYLASE, LIPASE, AMMONIA  Electrolytes Lab Results  Component Value Date   NA 139 11/24/2016   K 3.2 (L) 11/24/2016   CL 104 11/24/2016   CALCIUM 8.7 (L) 11/24/2016    Bone No results found for: VD25OH, CI874NY7UNU, CI6874NY7, CI7874NY7, 25OHVITD1, 25OHVITD2, 25OHVITD3, TESTOFREE, TESTOSTERONE  Inflammation (CRP: Acute Phase) (ESR: Chronic Phase) No results found for: CRP, ESRSEDRATE, LATICACIDVEN       Note: Above Lab results reviewed.  Recent Imaging Review  DG PAIN CLINIC C-ARM 1-60 MIN NO REPORT Fluoro was used, but  no Radiologist interpretation will be provided.  Please refer to NOTES tab for provider progress note. Note: Reviewed        Physical Exam  Vitals: BP (!) 153/71 (BP Location: Right Arm, Patient Position: Sitting, Cuff Size: Normal)   Pulse 63   Temp (!) 97 F (36.1 C) (Temporal)   Ht 5' (1.524 m)   Wt 165 lb (74.8 kg)   SpO2 96%   BMI 32.22 kg/m  BMI: Estimated body mass index is 32.22 kg/m as calculated from the following:   Height as of this encounter: 5' (1.524 m).   Weight as of this encounter: 165 lb (74.8 kg). Ideal: Ideal body weight: 45.5 kg (100 lb 4.9 oz) Adjusted ideal body weight: 57.2 kg (126 lb 3 oz) General appearance: Well nourished, well developed, and well hydrated. In no apparent acute distress Mental status: Alert, oriented x 3 (person, place, &  time)       Respiratory: No evidence of acute respiratory distress Eyes: PERLA  Musculoskeletal: Low back pain Neck pain (improved) Assessment   Diagnosis Status  1. Cervicalgia   2. Cervical radicular pain   3. Chronic pain syndrome   4. Lumbar facet arthropathy   5. Spinal stenosis, lumbar region, with neurogenic claudication   6. Degeneration of intervertebral disc of lumbar region, unspecified whether pain present   7. Chronic radicular lumbar pain   8. Medication management    Improved Improved Controlled   Updated Problems: Problem  Cervical Radicular Pain    Plan of Care  Problem-specific:  Assessment and Plan    Chronic lumbar facet arthropathy with chronic pain syndrome Persistent lumbar pain. Previous lumbar facet block ineffective. Exploring low-dose radiation therapy for osteoarthritis.  Medication management for chronic pain Current tramadol  regimen effective without side effects. Previous nausea due to fluconazole, now discontinued. - Prescribed tramadol  every eight hours with a quantity of ninety tablets to accommodate increased dosing frequency. - Sent prescription to pharmacy.  Patient's pain is controlled with tramadol , will continue on current medication regimen.  Prescribing drug monitoring (PDMP) reviewed; findings consistent with the use of prescribed medication and no evidence of narcotic misuse or abuse. Routine UDS order today. No side effects or adverse reaction reported to medication. Schedule follow-up in 90 days for medication management.       Ms. Ithzel Fedorchak has a current medication list which includes the following long-term medication(s): amlodipine, oyster shell calcium/d, diphenhydramine, duloxetine, hydrochlorothiazide, levothyroxine, loratadine, and losartan.  Pharmacotherapy (Medications Ordered): Meds ordered this encounter  Medications   traMADol  (ULTRAM ) 50 MG tablet    Sig: Take 1 tablet (50 mg total) by mouth every 8  (eight) hours as needed.    Dispense:  90 tablet    Refill:  2   Orders:  Orders Placed This Encounter  Procedures   ToxASSURE Select 13 (MW), Urine    Volume: 30 ml(s). Minimum 3 ml of urine is needed. Document temperature of fresh sample. Indications: Long term (current) use of opiate analgesic (S20.108)    Release to patient:   Immediate        Return in about 3 months (around 05/15/2024) for (F2F), (MM), Emmy Blanch NP.    Recent Visits Date Type Provider Dept  01/11/24 Procedure visit Marcelino Nurse, MD [facility]  12/19/23 Office Visit Shai Mckenzie K, NP [facility]  11/17/23 Office Visit Johnda Billiot K, NP [facility]  Showing recent visits within past 90 days and meeting all other requirements  Today's Visits Date Type Provider Dept  02/15/24 Office Visit Nolon Yellin K, NP [facility]  Showing today's visits and meeting all other requirements Future Appointments Date Type Provider Dept  05/09/24 Appointment Tatisha Cerino K, NP [facility]  Showing future appointments within next 90 days and meeting all other requirements  I discussed the assessment and treatment plan with the patient. The patient was provided an opportunity to ask questions and all were answered. The patient agreed with the plan and demonstrated an understanding of the instructions.  Patient advised to call back or seek an in-person evaluation if the symptoms or condition worsens.  I personally spent a total of 30 minutes in the care of the patient today including preparing to see the patient, getting/reviewing separately obtained history, performing a medically appropriate exam/evaluation, counseling and educating, placing orders, referring and communicating with other health care professionals, documenting clinical information in the EHR, independently interpreting results, communicating results, and coordinating care.  Note by: Wyat Infinger K Dione Mccombie, NP (TTS and  AI technology used. I apologize for any typographical errors that were not detected and corrected.) Date: 02/15/2024; Time: 12:11 PM

## 2024-02-18 LAB — TOXASSURE SELECT 13 (MW), URINE

## 2024-05-09 ENCOUNTER — Encounter: Admitting: Nurse Practitioner
